# Patient Record
Sex: Male | Born: 1970 | Race: White | Hispanic: No | Marital: Married | Smoking: Never smoker
Health system: Southern US, Community
[De-identification: ages and names within clinical notes are randomized; demographics above are authoritative.]

## PROBLEM LIST (undated history)

## (undated) DIAGNOSIS — Z789 Other specified health status: Secondary | ICD-10-CM

## (undated) HISTORY — PX: OTHER SURGICAL HISTORY: SHX169

---

## 2012-07-02 ENCOUNTER — Encounter (HOSPITAL_COMMUNITY): Payer: Self-pay | Admitting: Pharmacy Technician

## 2012-07-02 NOTE — Patient Instructions (Addendum)
Your procedure is scheduled on: 07/07/2012  Report to [facility] at   6:15  AM.  Call this number if you have problems the morning of surgery: [phone]   Remember:   Do not drink or eat food:After Midnight.  :     Do not wear jewelry, make-up or nail polish.  Do not wear lotions, powders, or perfumes. You may wear deodorant.  Do not shave 48 hours prior to surgery. Men may shave face and neck.  Do not bring valuables to the hospital.  Contacts, dentures or bridgework may not be worn into surgery.  Leave suitcase in the car. After surgery it may be brought to your room.  For patients admitted to the hospital, checkout time is 11:00 AM the day of discharge.   Patients discharged the day of surgery will not be allowed to drive home.    Special Instructions: Shower using CHG 2 nights before surgery and the night before surgery.  If you shower the day of surgery use CHG.  Use special wash - you have one bottle of CHG for all showers.  You should use approximately 1/3 of the bottle for each shower.   Please read over the following fact sheets that you were given: Pain Booklet, MRSA Information, Surgical Site Infection Prevention and Care and Recovery After Surgery  Umbilical Herniorrhaphy Care After Refer to this sheet in the next few weeks. These instructions provide you with information on caring for yourself after your procedure. Your caregiver may also give you more specific instructions. Your treatment has been planned according to current medical practices, but problems sometimes occur. Call your caregiver if you have any problems or questions after your procedure. HOME CARE INSTRUCTIONS  If you are given antibiotic medicine, take it as directed. Finish it even if you start to feel better.  Only take over-the-counter or prescription medicines for pain, fever, or discomfort as directed by your caregiver. Do not take aspirin. It can cause bleeding.  Do not get your surgical cut  (incision) area wet unless your caregiver says it is okay.  Avoid lifting objects heavier than 10 lb (4.5 kg) for 8 weeks after surgery.  Avoid sexual activity for 5 weeks after surgery or as directed by your caregiver.  Do not drive while taking prescription pain medicine.  You may return to your other normal, daily activities after 3 days or as directed by your caregiver. SEEK MEDICAL CARE IF:  You notice blood or fluid leaking from the surgical site.  Your incision area becomes red or swollen.  Your pain at the surgical site becomes worse or is not relieved by medicine.  You have problems urinating.  You feel nauseous or vomit more than 2 days after surgery.  You notice the bulge in your abdomen returns after the procedure. SEEK IMMEDIATE MEDICAL CARE IF:  You have a fever.  You have nausea or vomiting that will not stop. MAKE SURE YOU:  Understand these instructions.  Will watch your condition.  Will get help right away if you are not doing well or get worse. Document Released: 10/09/2011 Document Reviewed: 07/11/2011 [facility] Patient Information 2013 [place], [place]. PATIENT INSTRUCTIONS POST-ANESTHESIA  IMMEDIATELY FOLLOWING SURGERY:  Do not drive or operate machinery for the first twenty four hours after surgery.  Do not make any important decisions for twenty four hours after surgery or while taking narcotic pain medications or sedatives.  If you develop intractable nausea and vomiting or a severe headache please notify your  doctor immediately.  FOLLOW-UP:  Please make an appointment with your surgeon as instructed. You do not need to follow up with anesthesia unless specifically instructed to do so.  WOUND CARE INSTRUCTIONS (if applicable):  Keep a dry clean dressing on the anesthesia/puncture wound site if there is drainage.  Once the wound has quit draining you may leave it open to air.  Generally you should leave the bandage intact for twenty four hours unless  there is drainage.  If the epidural site drains for more than 36-48 hours please call the anesthesia department.  QUESTIONS?:  Please feel free to call your physician or the hospital operator if you have any questions, and they will be happy to assist you.

## 2012-07-03 ENCOUNTER — Encounter (HOSPITAL_COMMUNITY)
Admission: RE | Admit: 2012-07-03 | Discharge: 2012-07-03 | Disposition: A | Discharge status: Home / Self Care | Payer: BC Managed Care – PPO | Source: Ambulatory Visit | Attending: General Surgery | Admitting: General Surgery

## 2012-07-03 ENCOUNTER — Encounter (HOSPITAL_COMMUNITY): Payer: Self-pay

## 2012-07-03 HISTORY — DX: Other specified health status: Z78.9

## 2012-07-03 LAB — BASIC METABOLIC PANEL
GFR calc Af Amer: >90 mL/min (ref >90)
GFR calc non Af Amer: >90 mL/min (ref >90)
Potassium: 4.5 mEq/L (ref 3.5–5.1)
Sodium: 140 mEq/L (ref 135–145)

## 2012-07-03 LAB — CBC WITH DIFFERENTIAL/PLATELET
Hemoglobin: 15.3 g/dL (ref 13.0–17.0)
Lymphs Abs: 2.1 10*3/uL (ref 0.7–4.0)
Monocytes Relative: 8 % (ref 3–12)
Neutro Abs: 2.9 10*3/uL (ref 1.7–7.7)
Neutrophils Relative %: 51 % (ref 43–77)
RBC: 4.98 MIL/uL (ref 4.22–5.81)
WBC: 5.6 10*3/uL (ref 4.0–10.5)

## 2012-07-03 LAB — SURGICAL PCR SCREEN
MRSA, PCR: NEGATIVE
Staphylococcus aureus: NEGATIVE

## 2012-07-07 ENCOUNTER — Encounter (HOSPITAL_COMMUNITY): Payer: Self-pay | Admitting: *Deleted

## 2012-07-07 ENCOUNTER — Encounter (HOSPITAL_COMMUNITY): Payer: Self-pay | Admitting: Anesthesiology

## 2012-07-07 ENCOUNTER — Ambulatory Visit (HOSPITAL_COMMUNITY): Payer: BC Managed Care – PPO | Admitting: Anesthesiology

## 2012-07-07 ENCOUNTER — Ambulatory Visit (HOSPITAL_COMMUNITY)
Admission: RE | Admit: 2012-07-07 | Discharge: 2012-07-07 | Disposition: A | Discharge status: Home / Self Care | Payer: BC Managed Care – PPO | Source: Ambulatory Visit | Attending: General Surgery | Admitting: General Surgery

## 2012-07-07 ENCOUNTER — Encounter (HOSPITAL_COMMUNITY)
Admission: RE | Disposition: A | Discharge status: Home / Self Care | Payer: Self-pay | Source: Ambulatory Visit | Attending: General Surgery

## 2012-07-07 DIAGNOSIS — K429 Umbilical hernia without obstruction or gangrene: Secondary | ICD-10-CM

## 2012-07-07 DIAGNOSIS — Z01812 Encounter for preprocedural laboratory examination: Secondary | ICD-10-CM | POA: Insufficient documentation

## 2012-07-07 HISTORY — PX: UMBILICAL HERNIA REPAIR: SHX196

## 2012-07-07 HISTORY — PX: INSERTION OF MESH: SHX5868

## 2012-07-07 SURGERY — REPAIR, HERNIA, UMBILICAL, ADULT
Anesthesia: General | Site: Abdomen | Wound class: Clean

## 2012-07-07 MED ORDER — PROPOFOL 10 MG/ML IV EMUL
INTRAVENOUS | Status: AC
  Filled 2012-07-07: qty 20

## 2012-07-07 MED ORDER — BUPIVACAINE HCL (PF) 0.5 % IJ SOLN
INTRAMUSCULAR | Status: DC | PRN
  Administered 2012-07-07: 10 mL

## 2012-07-07 MED ORDER — MIDAZOLAM HCL 2 MG/2ML IJ SOLN
1.0000 mg | INTRAMUSCULAR | Status: DC | PRN
  Administered 2012-07-07: 2 mg via INTRAVENOUS

## 2012-07-07 MED ORDER — CEFAZOLIN SODIUM-DEXTROSE 2-3 GM-% IV SOLR
2.0000 g | INTRAVENOUS | Status: AC
  Administered 2012-07-07: 2 g via INTRAVENOUS

## 2012-07-07 MED ORDER — LIDOCAINE HCL (PF) 1 % IJ SOLN
INTRAMUSCULAR | Status: AC
  Filled 2012-07-07: qty 5

## 2012-07-07 MED ORDER — SODIUM CHLORIDE 0.9 % IR SOLN
Status: DC | PRN
  Administered 2012-07-07: 1000 mL

## 2012-07-07 MED ORDER — LACTATED RINGERS IV SOLN
INTRAVENOUS | Status: DC
  Administered 2012-07-07: 1000 mL via INTRAVENOUS

## 2012-07-07 MED ORDER — BUPIVACAINE HCL (PF) 0.5 % IJ SOLN
INTRAMUSCULAR | Status: AC
  Filled 2012-07-07: qty 30

## 2012-07-07 MED ORDER — ONDANSETRON HCL 4 MG/2ML IJ SOLN
INTRAMUSCULAR | Status: AC
  Filled 2012-07-07: qty 2

## 2012-07-07 MED ORDER — ENOXAPARIN SODIUM 40 MG/0.4ML ~~LOC~~ SOLN
SUBCUTANEOUS | Status: AC
  Filled 2012-07-07: qty 0.4

## 2012-07-07 MED ORDER — CEFAZOLIN SODIUM-DEXTROSE 2-3 GM-% IV SOLR
INTRAVENOUS | Status: AC
  Filled 2012-07-07: qty 50

## 2012-07-07 MED ORDER — ENOXAPARIN SODIUM 40 MG/0.4ML ~~LOC~~ SOLN
40.0000 mg | Freq: Once | SUBCUTANEOUS | Status: AC
  Administered 2012-07-07: 40 mg via SUBCUTANEOUS

## 2012-07-07 MED ORDER — HYDROCODONE-ACETAMINOPHEN 5-325 MG PO TABS: 1.0000 | ORAL_TABLET | ORAL | Status: DC | PRN

## 2012-07-07 MED ORDER — ONDANSETRON HCL 4 MG/2ML IJ SOLN
4.0000 mg | Freq: Once | INTRAMUSCULAR | Status: AC
  Administered 2012-07-07: 4 mg via INTRAVENOUS

## 2012-07-07 MED ORDER — LIDOCAINE HCL 1 % IJ SOLN
INTRAMUSCULAR | Status: DC | PRN
  Administered 2012-07-07: 30 mg via INTRADERMAL

## 2012-07-07 MED ORDER — PROPOFOL 10 MG/ML IV EMUL
INTRAVENOUS | Status: DC | PRN
  Administered 2012-07-07: 180 mg via INTRAVENOUS

## 2012-07-07 MED ORDER — MIDAZOLAM HCL 2 MG/2ML IJ SOLN
INTRAMUSCULAR | Status: AC
  Filled 2012-07-07: qty 2

## 2012-07-07 MED ORDER — CELECOXIB 100 MG PO CAPS
ORAL_CAPSULE | ORAL | Status: AC
  Filled 2012-07-07: qty 4

## 2012-07-07 MED ORDER — CHLORHEXIDINE GLUCONATE 4 % EX LIQD: 1.0000 "application " | Freq: Once | CUTANEOUS | Status: DC

## 2012-07-07 MED ORDER — FENTANYL CITRATE 0.05 MG/ML IJ SOLN: 25.0000 ug | INTRAMUSCULAR | Status: DC | PRN

## 2012-07-07 MED ORDER — LACTATED RINGERS IV SOLN
INTRAVENOUS | Status: DC | PRN
  Administered 2012-07-07: 07:00:00 via INTRAVENOUS

## 2012-07-07 MED ORDER — ROCURONIUM BROMIDE 50 MG/5ML IV SOLN
INTRAVENOUS | Status: AC
  Filled 2012-07-07: qty 1

## 2012-07-07 MED ORDER — SURGILUBE EX GEL
CUTANEOUS | Status: AC
  Filled 2012-07-07: qty 3

## 2012-07-07 MED ORDER — ONDANSETRON HCL 4 MG/2ML IJ SOLN: 4.0000 mg | Freq: Once | INTRAMUSCULAR | Status: DC | PRN

## 2012-07-07 MED ORDER — CELECOXIB 100 MG PO CAPS
400.0000 mg | ORAL_CAPSULE | Freq: Every day | ORAL | Status: AC
  Administered 2012-07-07: 400 mg via ORAL

## 2012-07-07 MED ORDER — FENTANYL CITRATE 0.05 MG/ML IJ SOLN
INTRAMUSCULAR | Status: DC | PRN
  Administered 2012-07-07 (×2): 50 ug via INTRAVENOUS
  Administered 2012-07-07: 100 ug via INTRAVENOUS
  Administered 2012-07-07: 50 ug via INTRAVENOUS

## 2012-07-07 MED ORDER — FENTANYL CITRATE 0.05 MG/ML IJ SOLN
INTRAMUSCULAR | Status: AC
  Filled 2012-07-07: qty 5

## 2012-07-07 SURGICAL SUPPLY — 36 items
BAG HAMPER (MISCELLANEOUS) ×2 IMPLANT
BENZOIN TINCTURE PRP APPL 2/3 (GAUZE/BANDAGES/DRESSINGS) ×2 IMPLANT
CLOTH BEACON ORANGE TIMEOUT ST (SAFETY) ×2 IMPLANT
COVER LIGHT HANDLE STERIS (MISCELLANEOUS) ×4 IMPLANT
DECANTER SPIKE VIAL GLASS SM (MISCELLANEOUS) ×2 IMPLANT
DURAPREP 26ML APPLICATOR (WOUND CARE) ×2 IMPLANT
ELECT REM PT RETURN 9FT ADLT (ELECTROSURGICAL) ×2
ELECTRODE REM PT RTRN 9FT ADLT (ELECTROSURGICAL) ×1 IMPLANT
GLOVE BIOGEL PI IND STRL 6.5 (GLOVE) ×1 IMPLANT
GLOVE BIOGEL PI IND STRL 7.0 (GLOVE) ×2 IMPLANT
GLOVE BIOGEL PI IND STRL 7.5 (GLOVE) ×1 IMPLANT
GLOVE BIOGEL PI INDICATOR 6.5 (GLOVE) ×1
GLOVE BIOGEL PI INDICATOR 7.0 (GLOVE) ×2
GLOVE BIOGEL PI INDICATOR 7.5 (GLOVE) ×1
GLOVE ECLIPSE 6.5 STRL STRAW (GLOVE) ×2 IMPLANT
GLOVE ECLIPSE 7.0 STRL STRAW (GLOVE) ×4 IMPLANT
GOWN STRL REIN XL XLG (GOWN DISPOSABLE) ×6 IMPLANT
INST SET MINOR GENERAL (KITS) ×2 IMPLANT
KIT ROOM TURNOVER APOR (KITS) ×2 IMPLANT
MANIFOLD NEPTUNE II (INSTRUMENTS) ×2 IMPLANT
NEEDLE HYPO 25X1 1.5 SAFETY (NEEDLE) ×2 IMPLANT
NS IRRIG 1000ML POUR BTL (IV SOLUTION) ×2 IMPLANT
PACK MINOR (CUSTOM PROCEDURE TRAY) ×2 IMPLANT
PAD ARMBOARD 7.5X6 YLW CONV (MISCELLANEOUS) ×2 IMPLANT
PATCH VENTRAL SMALL 4.3 (Mesh Specialty) ×2 IMPLANT
SET BASIN LINEN APH (SET/KITS/TRAYS/PACK) ×2 IMPLANT
STRIP CLOSURE SKIN 1/2X4 (GAUZE/BANDAGES/DRESSINGS) ×2 IMPLANT
SUT ETHIBOND CT1 BRD 2-0 30IN (SUTURE) IMPLANT
SUT ETHIBOND NAB MO 7 #0 18IN (SUTURE) ×2 IMPLANT
SUT MNCRL AB 4-0 PS2 18 (SUTURE) ×2 IMPLANT
SUT VIC AB 2-0 CT2 27 (SUTURE) IMPLANT
SUT VIC AB 3-0 SH 27 (SUTURE) ×1
SUT VIC AB 3-0 SH 27X BRD (SUTURE) ×1 IMPLANT
SUT VICRYL AB 3 0 TIES (SUTURE) IMPLANT
SYR BULB IRRIGATION 50ML (SYRINGE) ×2 IMPLANT
SYR CONTROL 10ML LL (SYRINGE) ×2 IMPLANT

## 2012-07-07 NOTE — Preoperative (Signed)
Beta Blockers   Reason not to administer Beta Blockers:Not Applicable 

## 2012-07-07 NOTE — Transfer of Care (Signed)
Immediate Anesthesia Transfer of Care Note  Patient: Jesse Wilkins  Procedure(s) Performed: Procedure(s): HERNIA REPAIR UMBILICAL ADULT (N/A) INSERTION OF MESH (N/A)  Patient Location: PACU  Anesthesia Type:General  Level of Consciousness: awake, alert  and oriented  Airway & Oxygen Therapy: Patient Spontanous Breathing and Patient connected to nasal cannula oxygen  Post-op Assessment: Report given to PACU RN and Post -op Vital signs reviewed and stable  Post vital signs: Reviewed and stable  Complications: No apparent anesthesia complications

## 2012-07-07 NOTE — Anesthesia Preprocedure Evaluation (Signed)
Anesthesia Evaluation  Patient identified by MRN, date of birth, ID band Patient awake    Reviewed: Allergy & Precautions, H&P , NPO status , Patient's Chart, lab work & pertinent test results  History of Anesthesia Complications Negative for: history of anesthetic complications  Airway Mallampati: II  TM Distance: >3 FB     Dental  (+) Teeth Intact   Pulmonary neg pulmonary ROS,  breath sounds clear to auscultation        Cardiovascular negative cardio ROS  Rhythm:Regular Rate:Normal     Neuro/Psych    GI/Hepatic negative GI ROS,   Endo/Other    Renal/GU      Musculoskeletal   Abdominal   Peds  Hematology   Anesthesia Other Findings   Reproductive/Obstetrics                             Anesthesia Physical Anesthesia Plan  ASA: I  Anesthesia Plan: General   Post-op Pain Management:    Induction: Intravenous  Airway Management Planned: LMA  Additional Equipment:   Intra-op Plan:   Post-operative Plan: Extubation in OR  Informed Consent: I have reviewed the patients History and Physical, chart, labs and discussed the procedure including the risks, benefits and alternatives for the proposed anesthesia with the patient or authorized representative who has indicated his/her understanding and acceptance.     Plan Discussed with:   Anesthesia Plan Comments:         Anesthesia Quick Evaluation  

## 2012-07-07 NOTE — Op Note (Signed)
Patient:  Jesse Wilkins  DOB:  [birth year]  MRN:  [id number]   Preop Diagnosis:  Umbilical hernia  Postop Diagnosis:  The same  Procedure:  Umbilical hernia repair with ventral patch  Surgeon:  Dr. Tilford Pillar  Anes:  General via laryngeal mask airway  Indications:  Patient is a 42 year old male who presented to my office with a history of a umbilical bulge. Workup and evaluation was consistent for an umbilical hernia. Risks benefits alternatives an umbilical hernia repair were discussed at length patient including but not limited to risk of bleeding, infection, infection and mesh requiring removal and subsequent repair, recurrence, intraoperative cardiac and pulmonary events. Patient's questions and concerns are addressed the patient as consented for the planned procedure.  Procedure note:  Patient was taken to the operating room was placed in a supine position the or to able which time the general anesthesia was administered. With the patient was asleep, a laryngeal mask airway was placed by the nurse anesthetist. At this point his abdomen was prepped with DuraPrep solution and draped in standard fashion. A skin incision is marked after time out was performed. The incision was created with a 15 blade scalpel with a semilunar incision infra-umbilically. Additional dissection down to subcuticular tissues carried out using electrocautery. The fascia is identified. The hernia defect is identified. The hernia sac and umbilical stalk are bluntly dissected around using a hemostat. The hernia sac is dissected carefully off of the umbilical stalk. The hernia sac is circumferentially freed from the fascia and reduced back into the abdominal cavity. At this point inspection of the umbilical fascial defect demonstrated a sizable opening. I opted to ring a ventral patch to the field and inserted this into the defect. This was secured to the fascia with 0 Ethibond suture. The fascia was reapproximated over  the patch using interrupted 0 Ethibond suture. As quite pleased with the appearance of the repair. The wound was irrigated. Local anesthetic was instilled. A 3-0 Vicryl suture was utilized to pexed the umbilical stalk to the fascia. The deep subcuticular tissue was reapproximated using a 3-0 Vicryl in simple or fashion. The skin edges reapproximated using a 4-0 Monocryl in a running subcuticular suture. Skin was washed dried moist dry towel. Benzoin is applied around the incision. Half-inch are suture placed. The drapes removed. The patient left come out of general anesthetic. He is transferred to the PACU in stable condition. At the conclusion of procedure all instrument, sponge, needle counts are correct.  Patient tolerated procedure extremely well.  Complications:  None apparent  EBL:  Minimal  Specimen:  None

## 2012-07-07 NOTE — Anesthesia Postprocedure Evaluation (Signed)
  Anesthesia Post-op Note  Patient: Jesse Wilkins  Procedure(s) Performed: Procedure(s): HERNIA REPAIR UMBILICAL ADULT (N/A) INSERTION OF MESH (N/A)  Patient Location: PACU  Anesthesia Type:General  Level of Consciousness: awake, alert , oriented and patient cooperative  Airway and Oxygen Therapy: Patient Spontanous Breathing  Post-op Pain: mild  Post-op Assessment: Post-op Vital signs reviewed, Patient's Cardiovascular Status Stable, Respiratory Function Stable, Patent Airway, No signs of Nausea or vomiting, Adequate PO intake, Pain level controlled, No headache, No backache, No residual numbness and No residual motor weakness  Post-op Vital Signs: Reviewed and stable  Complications: No apparent anesthesia complications

## 2012-07-07 NOTE — Interval H&P Note (Signed)
History and Physical Interval Note:  07/07/2012 7:52 AM  Jesse Wilkins  has presented today for surgery, with the diagnosis of umbilical hernia  The various methods of treatment have been discussed with the patient and family. After consideration of risks, benefits and other options for treatment, the patient has consented to  Procedure(s): HERNIA REPAIR UMBILICAL ADULT (N/A) as a surgical intervention .  The patient's history has been reviewed, patient examined, no change in status, stable for surgery.  I have reviewed the patient's chart and labs.  Questions were answered to the patient's satisfaction.     [name]

## 2012-07-07 NOTE — H&P (Signed)
  NTS SOAP Note  Vital Signs:  Vitals as of: 07/01/2012: Systolic 129: Diastolic 83: Heart Rate 65: Temp 98.60F: Height 54ft 11in: Weight 209Lbs 0 Ounces: Pain Level 2: BMI 29  BMI : 29.15 kg/m2  Subjective: This 42 Years 39 Months old Male presents for of umbilical hernia.  Noted over the last couples years "innie became outie"  Some discomfort/pain with straining.  Had to lift daughter several times and noted increased abdominal pain around that area.  No change with BM but occassional nausea.  No fever or chills.  No episodes where he was unable to reduce. Occassional lightheadedness with pain.   Review of Symptoms:  Constitutional:unremarkable   Head:unremarkable    Eyes:unremarkable   Nose/Mouth/Throat:unremarkable Cardiovascular:  unremarkable   Respiratory:unremarkable       as per HPI Genitourinary:unremarkable     Musculoskeletal:unremarkable   Skin:unremarkable Breast:unremarkable   Hematolgic/Lymphatic:unremarkable     Allergic/Immunologic:unremarkable     Past Medical History:  Obtained     Past Medical History  Surgical History: none Medical Problems: none Psychiatric History: none Allergies: NKDA Medications: none   Social History:Obtained  Social History  Preferred Language: English Race:  White Ethnicity: Not Hispanic / Latino Age: 42 Years 4 Months Marital Status:  M Alcohol: none Recreational drug(s): none   Smoking Status: Never smoker reviewed on 07/01/2012 Functional Status reviewed on mm/dd/yyyy ------------------------------------------------ Bathing: Normal Cooking: Normal Dressing: Normal Driving: Normal Eating: Normal Managing Meds: Normal Oral Care: Normal Shopping: Normal Toileting: Normal Transferring: Normal Walking: Normal Cognitive Status reviewed on mm/dd/yyyy ------------------------------------------------ Attention: Normal Decision Making: Normal Language: Normal Memory:  Normal Motor: Normal Perception: Normal Problem Solving: Normal Visual and Spatial: Normal   Family History:Obtained     Family History  Is there a family history WG:[id number]    Objective Information: General:  Well appearing, well nourished in no distress. Skin:     no rash or prominent lesions Head:Atraumatic; no masses; no abnormalities Eyes:  conjunctiva clear, EOM intact, PERRL Mouth:  Mucous membranes moist, no mucosal lesions. Neck:  Supple without lymphadenopathy.  Heart:  RRR, no murmur Lungs:    CTA bilaterally, no wheezes, rhonchi, rales.  Breathing unlabored. Abdomen:Soft, NT/ND, no HSM, no masses.  + reducible umbilical hernia. Extremities:  No deformities, clubbing, cyanosis, or edema.   Assessment:    Plan: Umbilical hernia.  Options discussed with the patient.  Will schedule  at his convenience.    Patient Education:Alternative treatments to surgery were discussed with patient (and family).  Risks and benefits  of procedure were fully explained to the patient (and family) who gave informed consent. Patient/family questions were addressed.  Follow-up:Pending Surgery

## 2012-07-09 ENCOUNTER — Encounter (HOSPITAL_COMMUNITY): Payer: Self-pay | Admitting: General Surgery

## 2012-08-25 ENCOUNTER — Emergency Department (HOSPITAL_COMMUNITY)
Admission: EM | Admit: 2012-08-25 | Discharge: 2012-08-25 | Disposition: A | Discharge status: Home / Self Care | Payer: BC Managed Care – PPO | Attending: Emergency Medicine | Admitting: Emergency Medicine

## 2012-08-25 ENCOUNTER — Emergency Department (HOSPITAL_COMMUNITY): Payer: BC Managed Care – PPO

## 2012-08-25 ENCOUNTER — Encounter (HOSPITAL_COMMUNITY): Payer: Self-pay

## 2012-08-25 DIAGNOSIS — R6883 Chills (without fever): Secondary | ICD-10-CM | POA: Insufficient documentation

## 2012-08-25 DIAGNOSIS — R109 Unspecified abdominal pain: Secondary | ICD-10-CM | POA: Insufficient documentation

## 2012-08-25 DIAGNOSIS — Y849 Medical procedure, unspecified as the cause of abnormal reaction of the patient, or of later complication, without mention of misadventure at the time of the procedure: Secondary | ICD-10-CM | POA: Insufficient documentation

## 2012-08-25 DIAGNOSIS — IMO0001 Reserved for inherently not codable concepts without codable children: Secondary | ICD-10-CM

## 2012-08-25 DIAGNOSIS — T8140XA Infection following a procedure, unspecified, initial encounter: Secondary | ICD-10-CM | POA: Insufficient documentation

## 2012-08-25 DIAGNOSIS — L089 Local infection of the skin and subcutaneous tissue, unspecified: Secondary | ICD-10-CM | POA: Insufficient documentation

## 2012-08-25 LAB — BASIC METABOLIC PANEL
BUN: 12 mg/dL (ref 6–23)
CO2: 29 mEq/L (ref 19–32)
Calcium: 9 mg/dL (ref 8.4–10.5)
Chloride: 101 mEq/L (ref 96–112)
Creatinine, Ser: 0.99 mg/dL (ref 0.50–1.35)
GFR calc Af Amer: >90 mL/min (ref >90)
GFR calc non Af Amer: >90 mL/min (ref >90)
Glucose, Bld: 93 mg/dL (ref 70–99)
Potassium: 4.1 mEq/L (ref 3.5–5.1)
Sodium: 137 mEq/L (ref 135–145)

## 2012-08-25 LAB — CBC WITH DIFFERENTIAL/PLATELET
Basophils Absolute: 0 10*3/uL (ref 0.0–0.1)
Basophils Relative: 0 % (ref 0–1)
Eosinophils Absolute: 0.1 10*3/uL (ref 0.0–0.7)
Eosinophils Relative: 3 % (ref 0–5)
HCT: 40 % (ref 39.0–52.0)
Hemoglobin: 14.1 g/dL (ref 13.0–17.0)
MCH: 31 pg (ref 26.0–34.0)
MCHC: 35.3 g/dL (ref 30.0–36.0)
MCV: 87.9 fL (ref 78.0–100.0)
Monocytes Absolute: 0.3 10*3/uL (ref 0.1–1.0)
Monocytes Relative: 6 % (ref 3–12)
RDW: 12.1 % (ref 11.5–15.5)

## 2012-08-25 MED ORDER — SULFAMETHOXAZOLE-TRIMETHOPRIM 800-160 MG PO TABS: 1.0000 | ORAL_TABLET | Freq: Two times a day (BID) | ORAL | Status: AC

## 2012-08-25 MED ORDER — IOHEXOL 300 MG/ML  SOLN
100.0000 mL | Freq: Once | INTRAMUSCULAR | Status: AC | PRN
  Administered 2012-08-25: 100 mL via INTRAVENOUS

## 2012-08-25 NOTE — ED Notes (Signed)
Pt states he had a hernia repair in March by Dr. Elisabeth Pigeon he was having some drainage and was evaluated by Dr. Caesar Bookman this past Thursday and was given Keflex. States the surgical site is red, hard and on Saturday morning there was a lot of bleeding from the site.

## 2012-08-25 NOTE — ED Provider Notes (Signed)
History    This chart was scribed for Dione Booze, MD by Leone Payor, ED Scribe. This patient was seen in room [facility] and the patient's care was started 11:50 AM.   CSN: [id number]  Arrival date & time 08/25/12  1032   First MD Initiated Contact with Patient 08/25/12 1143      Chief Complaint  Patient presents with  . Wound Infection     The history is provided by the patient. No language interpreter was used.    Jesse Wilkins is a 42 y.o. male who presents to the Emergency Department complaining of a wound infection. Pt states he had an umbilical hernia repair in March 2014 by Dr. Caesar Bookman. States he was having some drainage and was evaluated by Dr. Caesar Bookman this past Thrusday (08/21/12) and was given Keflex. He states the surgical site is red, hard, and 1 day ago there was bleeding from the site. He rates the pain as 3/10 when there is no pressure applied. He had some chills earlier this week. He denies any fever, diaphoresis.    Past Medical History  Diagnosis Date  . Medical history non-contributory     Past Surgical History  Procedure Laterality Date  . Adnoid    . Umbilical hernia repair N/A 07/07/2012    Procedure: HERNIA REPAIR UMBILICAL ADULT;  Surgeon: Fabio Bering, MD;  Location: [facility];  Service: General;  Laterality: N/A;  . Insertion of mesh N/A 07/07/2012    Procedure: INSERTION OF MESH;  Surgeon: Fabio Bering, MD;  Location: [facility];  Service: General;  Laterality: N/A;    No family history on file.  History  Substance Use Topics  . Smoking status: Never Smoker   . Smokeless tobacco: Not on file  . Alcohol Use: No     Comment: occasionaly      Review of Systems  Constitutional: Positive for chills. Negative for fever and diaphoresis.  All other systems reviewed and are negative.    Allergies  Review of patient's allergies indicates no known allergies.  Home Medications   Current Outpatient Rx  Name  Route  Sig  Dispense  Refill  .  cephALEXin (KEFLEX) 500 MG capsule   Oral   Take 500 mg by mouth 4 (four) times daily. Starting 08/21/2012 x 5 days.         Marland Kitchen ibuprofen (ADVIL,MOTRIN) 200 MG tablet   Oral   Take 600 mg by mouth every 6 (six) hours as needed for pain.           BP 140/90  Pulse 72  Temp(Src) 98.1 F (36.7 C) (Oral)  Resp 18  Ht 5\' 11"  (1.803 m)  Wt 205 lb (92.987 kg)  BMI 28.6 kg/m2  SpO2 100%  Physical Exam  Nursing note and vitals reviewed. Constitutional: He is oriented to person, place, and time. He appears well-developed and well-nourished. No distress.  HENT:  Head: Normocephalic and atraumatic.  Eyes: EOM are normal.  Neck: Neck supple. No tracheal deviation present.  Cardiovascular: Normal rate, regular rhythm and normal heart sounds.   Pulmonary/Chest: Effort normal. No respiratory distress.  Abdominal:  Surgical scar inferior to umbilicus that appears to be healing well without apparent infection. Area of erythema, induration in the umbilicus with slight sanguinous drainage. Periumbilical area is moderately indurated and tender.  Musculoskeletal: Normal range of motion.  Neurological: He is alert and oriented to person, place, and time.  Skin: Skin is warm and dry.  Psychiatric: He has  a normal mood and affect. His behavior is normal.    ED Course  Procedures (including critical care time)  DIAGNOSTIC STUDIES: Oxygen Saturation is 100% on room air, normal by my interpretation.    COORDINATION OF CARE: 11:50 AM Discussed treatment plan with pt at bedside and pt agreed to plan.   Results for orders placed during the hospital encounter of 08/25/12  CBC WITH DIFFERENTIAL      Result Value Range   WBC 5.2  4.0 - 10.5 K/uL   RBC 4.55  4.22 - 5.81 MIL/uL   Hemoglobin 14.1  13.0 - 17.0 g/dL   HCT 11.9  14.7 - 82.9 %   MCV 87.9  78.0 - 100.0 fL   MCH 31.0  26.0 - 34.0 pg   MCHC 35.3  30.0 - 36.0 g/dL   RDW 56.2  13.0 - 86.5 %   Platelets 214  150 - 400 K/uL   Neutrophils  Relative 51  43 - 77 %   Neutro Abs 2.7  1.7 - 7.7 K/uL   Lymphocytes Relative 41  12 - 46 %   Lymphs Abs 2.1  0.7 - 4.0 K/uL   Monocytes Relative 6  3 - 12 %   Monocytes Absolute 0.3  0.1 - 1.0 K/uL   Eosinophils Relative 3  0 - 5 %   Eosinophils Absolute 0.1  0.0 - 0.7 K/uL   Basophils Relative 0  0 - 1 %   Basophils Absolute 0.0  0.0 - 0.1 K/uL  BASIC METABOLIC PANEL      Result Value Range   Sodium 137  135 - 145 mEq/L   Potassium 4.1  3.5 - 5.1 mEq/L   Chloride 101  96 - 112 mEq/L   CO2 29  19 - 32 mEq/L   Glucose, Bld 93  70 - 99 mg/dL   BUN 12  6 - 23 mg/dL   Creatinine, Ser 7.84  0.50 - 1.35 mg/dL   Calcium 9.0  8.4 - 69.6 mg/dL   GFR calc non Af Amer >90  >90 mL/min   GFR calc Af Amer >90  >90 mL/min   Ct Abdomen Pelvis W Contrast  08/25/2012  *RADIOLOGY REPORT*  Clinical Data: Umbilical hernia repair  CT ABDOMEN AND PELVIS WITH CONTRAST  Technique:  Multidetector CT imaging of the abdomen and pelvis was performed following the standard protocol during bolus administration of intravenous contrast.  Contrast: OMNIPAQUE IOHEXOL 300 MG/ML  SOLN  Comparison: None.  Findings: Postoperative changes from umbilical hernia repair are present. There is no evidence of recurrent hernia.  At the hernia site, within the anterior intraperitoneal space, just deep to the hernia site, there is a small irregular round hyperdense structure with a small amount of fluid centrally.  The fluid measures 1.5 x 0.6 x 0.9 cm and contains an air bubble.  This may epresent a small amount of fluid within a hernia plug or patch for an inflammatory process.  There is also a 8.8 x 1.8 x 0.9 cm fluid collection within the subcutaneous fat at the hernia repair site containing a few gas bubbles.  This does extend to within 1 cm of the scan. Again, an inflammatory process or abscess cannot be excluded.  Diffuse hepatic steatosis.  The gallbladder is folded upon itself.  Spleen, pancreas, adrenal glands, kidneys are  within normal limits.  Bladder, prostate, and seminal vesicles are within normal limits.  No free fluid within the pelvis.  No evidence of abnormal  retroperitoneal adenopathy.  Small mesenteric nodes are noted.  No evidence of bowel obstruction.  No vertebral compression deformity.  Lumbosacral spine is transitional.  IMPRESSION: Status post umbilical hernia repair without evidence of recurrent hernia.  There are two very small fluid collections deep and superficial to the abdominal wall hernia repair site.  An inflammatory process is not excluded.   Original Report Authenticated By: Jolaine Click, M.D.       1. Wound, surgical, infected, initial encounter       MDM  Draining sites in the umbilicus without clear evidence of infection. This may be just a draining seroma but there could be some low-grade infection which is being controlled by cephalexin. However, he has not gotten completely better on cephalexin. Case discussed with Dr. Lovell Sheehan who is on call for Dr. Leticia Penna and decision was made to switch him to trimethoprim-sulfamethoxazole. He is to followup with Dr. Leticia Penna but is to return to the ED if symptoms are worsening or he starts developing a fever. He states that he has adequate analgesics at home. Old records are reviewed and his umbilical hernia surgery apparently was a complicated at that time.   I personally performed the services described in this documentation, which was scribed in my presence. The recorded information has been reviewed and is accurate.         Dione Booze, MD 08/25/12 [phone]

## 2012-08-25 NOTE — ED Notes (Signed)
Pt states that he started having pain around his hernia repair incision on Thursday, 08/21/12, and noticed that the area was red, swollen and hard. Pt reports waking up "in a pool of blood" on Saturday morning. Pt then came in today because he has been having serosanguinous drainage from his umbilicus, and there is a flap of skin coming from inside his umbilicus. The nurse notes serosanguinous drainage from the umbilicus and hardened areas above and below the umbilicus. Pt reports having a mesh put in place during his hernia repair surgery.

## 2012-08-28 LAB — WOUND CULTURE

## 2012-08-28 NOTE — ED Notes (Signed)
Post ED Visit - Positive Culture Follow-up  Culture report reviewed by antimicrobial stewardship pharmacist: []  Jesse Wilkins, Pharm.D., BCPS [x]  Celedonio Miyamoto, [address].D., BCPS []  Georgina Pillion, Pharm.D., BCPS []  [place], [address].D., BCPS, AAHIVP []  Estella Husk, Pharm.D., BCPS, AAHIV  Positive wound culture Treated with Sulfa-Trim- organism sensitive to the same and no further patient follow-up is required at this time.  Jesse Wilkins 08/28/2012, 6:38 PM

## 2012-08-29 ENCOUNTER — Telehealth (HOSPITAL_COMMUNITY): Payer: Self-pay | Admitting: Emergency Medicine

## 2012-08-29 NOTE — ED Notes (Signed)
Post ED Visit - Positive Culture Follow-up  Culture report reviewed by antimicrobial stewardship pharmacist: []  Wes Dulaney, Pharm.D., BCPS [x]  Celedonio Miyamoto, Pharm.D., BCPS []  Georgina Pillion, Pharm.D., BCPS []  [place], [address].D., BCPS, AAHIVP []  Estella Husk, Pharm.D., BCPS, AAHIV  Positive urine culture Treated with keflex and bactrim, organism sensitive to the same and no further patient follow-up is required at this time.  Kylie A Holland 08/29/2012, 2:23 PM

## 2013-03-27 NOTE — H&P (Signed)
  NTS SOAP Note  Vital Signs:  Vitals as of: 03/27/2013: Systolic 135: Diastolic 86: Heart Rate 67: Temp 98.84F: Height 46ft 11in: Weight 212Lbs 0 Ounces: BMI 29.57  BMI : 29.57 kg/m2  Subjective: This 42 Years old Male presents for a lump that has develped above umbilicus.  Started to have pain and swelling abofe umbilicus this past weekend.  Took percocet and laid down.  Lump went away.  Made worse with straining.  s/p umbilical herniorrhaphy with mesh 5/14.   Review of Symptoms:  Constitutional:unremarkable   Head:unremarkable    Eyes:unremarkable   Nose/Mouth/Throat:unremarkable Cardiovascular:  unremarkable   Respiratory:unremarkable   Gastrointestin    abdominal pain Genitourinary:unremarkable     Musculoskeletal:unremarkable   Skin:unremarkable Hematolgic/Lymphatic:unremarkable     Allergic/Immunologic:unremarkable     Past Medical History:    Reviewed   Past Medical History  Surgical History: umbilical herniorrhaphy with mesh 5/14 Medical Problems: none Psychiatric History: none Allergies: NKDA Medications: none   Social History:Reviewed  Social History  Preferred Language: English Race:  White Ethnicity: Not Hispanic / Latino Age: 41 Years 4 Months Marital Status:  M Alcohol: none Recreational drug(s): none   Smoking Status: Never smoker reviewed on 02/19/2013 Functional Status reviewed on mm/dd/yyyy ------------------------------------------------ Bathing: Normal Cooking: Normal Dressing: Normal Driving: Normal Eating: Normal Managing Meds: Normal Oral Care: Normal Shopping: Normal Toileting: Normal Transferring: Normal Walking: Normal Cognitive Status reviewed on mm/dd/yyyy ------------------------------------------------ Attention: Normal Decision Making: Normal Language: Normal Memory: Normal Motor: Normal Perception: Normal Problem Solving: Normal Visual and Spatial: Normal   Family  History:  Reviewed   Family Health History  Is there a family history VH:[id number]    Objective Information: General:  Well appearing, well nourished in no distress. Heart:  RRR, no murmur Lungs:    CTA bilaterally, no wheezes, rhonchi, rales.  Breathing unlabored. Abdomen:Soft, NT/ND, no HSM, no masses.  Reducible hernia just superior to umbilicus.  No hernia at umbilicus.  Assessment:Incisional hernia just superior to previous repair.  ?new hernia as previous repair seems intact.  Diagnosis &amp; Procedure Smart Code   Plan:  Discussed surgical repair with patient.  I told him he has a hernia just above previous repair.  All questions were answered.  Will call to schedule incisional herniorrhaphy with mesh in 12/22.  Instructed on how to reduce hernia prior to surgery.  Percocet for pain.  Patient Education:Alternative treatments to surgery were discussed with patient (and family).  Risks and benefits  of procedure including bleeding, infection, and recurrence of the hernia were fully explained to the patient (and family) who gave informed consent. Patient/family questions were addressed.  Follow-up:Pending Surgery

## 2013-04-06 ENCOUNTER — Encounter (HOSPITAL_COMMUNITY): Payer: Self-pay | Admitting: Pharmacy Technician

## 2013-04-06 NOTE — Patient Instructions (Signed)
Jesse Wilkins  04/06/2013   Your procedure is scheduled on:  04/13/2013  Report to [facility] at  615  AM.  Call this number if you have problems the morning of surgery: [phone]   Remember:   Do not eat food or drink liquids after midnight.   Take these medicines the morning of surgery with A SIP OF WATER: none   Do not wear jewelry, make-up or nail polish.  Do not wear lotions, powders, or perfumes.   Do not shave 48 hours prior to surgery. Men may shave face and neck.  Do not bring valuables to the hospital.  [facility] is not responsible for any belongings or valuables.               Contacts, dentures or bridgework may not be worn into surgery.  Leave suitcase in the car. After surgery it may be brought to your room.  For patients admitted to the hospital, discharge time is determined by your  treatment team.               Patients discharged the day of surgery will not be allowed to drive home.  Name and phone number of your driver: family  Special Instructions: Shower using CHG 2 nights before surgery and the night before surgery.  If you shower the day of surgery use CHG.  Use special wash - you have one bottle of CHG for all showers.  You should use approximately 1/3 of the bottle for each shower.   Please read over the following fact sheets that you were given: Pain Booklet, Coughing and Deep Breathing, Surgical Site Infection Prevention, Anesthesia Post-op Instructions and Care and Recovery After Surgery Hernia A hernia occurs when an internal organ pushes out through a weak spot in the abdominal wall. Hernias most commonly occur in the groin and around the navel. Hernias often can be pushed back into place (reduced). Most hernias tend to get worse over time. Some abdominal hernias can get stuck in the opening (irreducible or incarcerated hernia) and cannot be reduced. An irreducible abdominal hernia which is tightly squeezed into the opening is at risk for  impaired blood supply (strangulated hernia). A strangulated hernia is a medical emergency. Because of the risk for an irreducible or strangulated hernia, surgery may be recommended to repair a hernia. CAUSES   Heavy lifting.  Prolonged coughing.  Straining to have a bowel movement.  A cut (incision) made during an abdominal surgery. HOME CARE INSTRUCTIONS   Bed rest is not required. You may continue your normal activities.  Avoid lifting more than 10 pounds (4.5 kg) or straining.  Cough gently. If you are a smoker it is best to stop. Even the best hernia repair can break down with the continual strain of coughing. Even if you do not have your hernia repaired, a cough will continue to aggravate the problem.  Do not wear anything tight over your hernia. Do not try to keep it in with an outside bandage or truss. These can damage abdominal contents if they are trapped within the hernia sac.  Eat a normal diet.  Avoid constipation. Straining over long periods of time will increase hernia size and encourage breakdown of repairs. If you cannot do this with diet alone, stool softeners may be used. SEEK IMMEDIATE MEDICAL CARE IF:   You have a fever.  You develop increasing abdominal pain.  You feel nauseous or vomit.  Your hernia is stuck  outside the abdomen, looks discolored, feels hard, or is tender.  You have any changes in your bowel habits or in the hernia that are unusual for you.  You have increased pain or swelling around the hernia.  You cannot push the hernia back in place by applying gentle pressure while lying down. MAKE SURE YOU:   Understand these instructions.  Will watch your condition.  Will get help right away if you are not doing well or get worse. Document Released: 04/09/2005 Document Revised: 07/02/2011 Document Reviewed: 11/27/2007 [facility] Patient Information 2014 [place]. PATIENT INSTRUCTIONS POST-ANESTHESIA  IMMEDIATELY FOLLOWING SURGERY:  Do  not drive or operate machinery for the first twenty four hours after surgery.  Do not make any important decisions for twenty four hours after surgery or while taking narcotic pain medications or sedatives.  If you develop intractable nausea and vomiting or a severe headache please notify your doctor immediately.  FOLLOW-UP:  Please make an appointment with your surgeon as instructed. You do not need to follow up with anesthesia unless specifically instructed to do so.  WOUND CARE INSTRUCTIONS (if applicable):  Keep a dry clean dressing on the anesthesia/puncture wound site if there is drainage.  Once the wound has quit draining you may leave it open to air.  Generally you should leave the bandage intact for twenty four hours unless there is drainage.  If the epidural site drains for more than 36-48 hours please call the anesthesia department.  QUESTIONS?:  Please feel free to call your physician or the hospital operator if you have any questions, and they will be happy to assist you.

## 2013-04-07 ENCOUNTER — Encounter (HOSPITAL_COMMUNITY)
Admission: RE | Admit: 2013-04-07 | Discharge: 2013-04-07 | Disposition: A | Discharge status: Home / Self Care | Payer: BC Managed Care – PPO | Source: Ambulatory Visit | Attending: General Surgery | Admitting: General Surgery

## 2013-04-07 ENCOUNTER — Encounter (HOSPITAL_COMMUNITY): Payer: Self-pay

## 2013-04-07 DIAGNOSIS — Z01812 Encounter for preprocedural laboratory examination: Secondary | ICD-10-CM | POA: Insufficient documentation

## 2013-04-07 DIAGNOSIS — Z01818 Encounter for other preprocedural examination: Secondary | ICD-10-CM | POA: Insufficient documentation

## 2013-04-07 LAB — CBC WITH DIFFERENTIAL/PLATELET
Eosinophils Absolute: 0.2 10*3/uL (ref 0.0–0.7)
Eosinophils Relative: 3 % (ref 0–5)
Lymphs Abs: 2 10*3/uL (ref 0.7–4.0)
MCH: 31.2 pg (ref 26.0–34.0)
MCHC: 35.2 g/dL (ref 30.0–36.0)
MCV: 88.5 fL (ref 78.0–100.0)
Platelets: 199 10*3/uL (ref 150–400)
RBC: 4.78 MIL/uL (ref 4.22–5.81)
RDW: 12.2 % (ref 11.5–15.5)

## 2013-04-07 LAB — BASIC METABOLIC PANEL
BUN: 13 mg/dL (ref 6–23)
CO2: 25 mEq/L (ref 19–32)
Calcium: 9.4 mg/dL (ref 8.4–10.5)
Creatinine, Ser: 0.95 mg/dL (ref 0.50–1.35)
Glucose, Bld: 110 mg/dL — ABNORMAL HIGH (ref 70–99)

## 2013-04-13 ENCOUNTER — Ambulatory Visit (HOSPITAL_COMMUNITY)
Admission: RE | Admit: 2013-04-13 | Discharge: 2013-04-13 | Disposition: A | Discharge status: Home / Self Care | Payer: BC Managed Care – PPO | Source: Ambulatory Visit | Attending: General Surgery | Admitting: General Surgery

## 2013-04-13 ENCOUNTER — Encounter (HOSPITAL_COMMUNITY): Payer: Self-pay | Admitting: Anesthesiology

## 2013-04-13 ENCOUNTER — Ambulatory Visit (HOSPITAL_COMMUNITY): Payer: BC Managed Care – PPO | Admitting: Anesthesiology

## 2013-04-13 ENCOUNTER — Encounter (HOSPITAL_COMMUNITY)
Admission: RE | Disposition: A | Discharge status: Home / Self Care | Payer: Self-pay | Source: Ambulatory Visit | Attending: General Surgery

## 2013-04-13 ENCOUNTER — Encounter (HOSPITAL_COMMUNITY): Payer: BC Managed Care – PPO | Admitting: Anesthesiology

## 2013-04-13 DIAGNOSIS — Z6829 Body mass index (BMI) 29.0-29.9, adult: Secondary | ICD-10-CM | POA: Insufficient documentation

## 2013-04-13 DIAGNOSIS — K432 Incisional hernia without obstruction or gangrene: Secondary | ICD-10-CM | POA: Insufficient documentation

## 2013-04-13 HISTORY — PX: INCISIONAL HERNIA REPAIR: SHX193

## 2013-04-13 SURGERY — REPAIR, HERNIA, INCISIONAL
Anesthesia: General | Site: Abdomen

## 2013-04-13 MED ORDER — SUCCINYLCHOLINE CHLORIDE 20 MG/ML IJ SOLN
INTRAMUSCULAR | Status: AC
  Filled 2013-04-13: qty 1

## 2013-04-13 MED ORDER — LACTATED RINGERS IV SOLN
INTRAVENOUS | Status: DC
  Administered 2013-04-13: 1000 mL via INTRAVENOUS

## 2013-04-13 MED ORDER — FENTANYL CITRATE 0.05 MG/ML IJ SOLN
25.0000 ug | INTRAMUSCULAR | Status: AC
  Administered 2013-04-13 (×2): 25 ug via INTRAVENOUS

## 2013-04-13 MED ORDER — CEFAZOLIN SODIUM-DEXTROSE 2-3 GM-% IV SOLR
2.0000 g | INTRAVENOUS | Status: AC
  Administered 2013-04-13: 2 g via INTRAVENOUS

## 2013-04-13 MED ORDER — FENTANYL CITRATE 0.05 MG/ML IJ SOLN
INTRAMUSCULAR | Status: DC | PRN
  Administered 2013-04-13: 25 ug via INTRAVENOUS
  Administered 2013-04-13: 50 ug via INTRAVENOUS
  Administered 2013-04-13 (×2): 25 ug via INTRAVENOUS
  Administered 2013-04-13: 50 ug via INTRAVENOUS
  Administered 2013-04-13: 25 ug via INTRAVENOUS
  Administered 2013-04-13: 50 ug via INTRAVENOUS

## 2013-04-13 MED ORDER — PROPOFOL 10 MG/ML IV EMUL
INTRAVENOUS | Status: AC
  Filled 2013-04-13: qty 20

## 2013-04-13 MED ORDER — MIDAZOLAM HCL 2 MG/2ML IJ SOLN
1.0000 mg | INTRAMUSCULAR | Status: DC | PRN
  Administered 2013-04-13 (×2): 1 mg via INTRAVENOUS

## 2013-04-13 MED ORDER — FENTANYL CITRATE 0.05 MG/ML IJ SOLN
INTRAMUSCULAR | Status: AC
  Filled 2013-04-13: qty 2

## 2013-04-13 MED ORDER — SODIUM CHLORIDE 0.9 % IR SOLN
Status: DC | PRN
  Administered 2013-04-13: 1000 mL

## 2013-04-13 MED ORDER — LACTATED RINGERS IV SOLN
INTRAVENOUS | Status: DC | PRN
  Administered 2013-04-13 (×2): via INTRAVENOUS

## 2013-04-13 MED ORDER — LIDOCAINE HCL (CARDIAC) 20 MG/ML IV SOLN
INTRAVENOUS | Status: DC | PRN
  Administered 2013-04-13: 50 mg via INTRAVENOUS

## 2013-04-13 MED ORDER — ONDANSETRON HCL 4 MG/2ML IJ SOLN: 4.0000 mg | Freq: Once | INTRAMUSCULAR | Status: DC | PRN

## 2013-04-13 MED ORDER — FENTANYL CITRATE 0.05 MG/ML IJ SOLN
INTRAMUSCULAR | Status: AC
  Filled 2013-04-13: qty 5

## 2013-04-13 MED ORDER — BUPIVACAINE HCL (PF) 0.5 % IJ SOLN
INTRAMUSCULAR | Status: AC
  Filled 2013-04-13: qty 30

## 2013-04-13 MED ORDER — BUPIVACAINE HCL (PF) 0.5 % IJ SOLN
INTRAMUSCULAR | Status: DC | PRN
  Administered 2013-04-13: 10 mL

## 2013-04-13 MED ORDER — OXYCODONE-ACETAMINOPHEN 7.5-325 MG PO TABS: 1.0000 | ORAL_TABLET | ORAL | Status: DC | PRN

## 2013-04-13 MED ORDER — CHLORHEXIDINE GLUCONATE 4 % EX LIQD: 1.0000 "application " | Freq: Once | CUTANEOUS | Status: DC

## 2013-04-13 MED ORDER — MIDAZOLAM HCL 2 MG/2ML IJ SOLN
INTRAMUSCULAR | Status: AC
  Filled 2013-04-13: qty 2

## 2013-04-13 MED ORDER — KETOROLAC TROMETHAMINE 30 MG/ML IJ SOLN
30.0000 mg | Freq: Once | INTRAMUSCULAR | Status: AC
  Administered 2013-04-13: 30 mg via INTRAVENOUS
  Filled 2013-04-13: qty 1

## 2013-04-13 MED ORDER — PROPOFOL 10 MG/ML IV BOLUS
INTRAVENOUS | Status: DC | PRN
  Administered 2013-04-13: 50 mg via INTRAVENOUS
  Administered 2013-04-13: 150 mg via INTRAVENOUS

## 2013-04-13 MED ORDER — LIDOCAINE HCL (PF) 1 % IJ SOLN
INTRAMUSCULAR | Status: AC
  Filled 2013-04-13: qty 5

## 2013-04-13 MED ORDER — CEFAZOLIN SODIUM-DEXTROSE 2-3 GM-% IV SOLR
INTRAVENOUS | Status: AC
  Filled 2013-04-13: qty 50

## 2013-04-13 MED ORDER — POVIDONE-IODINE 10 % OINT PACKET
TOPICAL_OINTMENT | CUTANEOUS | Status: DC | PRN
  Administered 2013-04-13: 1 via TOPICAL

## 2013-04-13 MED ORDER — FENTANYL CITRATE 0.05 MG/ML IJ SOLN
25.0000 ug | INTRAMUSCULAR | Status: DC | PRN
  Administered 2013-04-13 (×2): 50 ug via INTRAVENOUS

## 2013-04-13 MED ORDER — POVIDONE-IODINE 10 % EX OINT
TOPICAL_OINTMENT | CUTANEOUS | Status: AC
  Filled 2013-04-13: qty 1

## 2013-04-13 SURGICAL SUPPLY — 31 items
BAG HAMPER (MISCELLANEOUS) ×2 IMPLANT
CLOTH BEACON ORANGE TIMEOUT ST (SAFETY) ×2 IMPLANT
COVER LIGHT HANDLE STERIS (MISCELLANEOUS) ×4 IMPLANT
DECANTER SPIKE VIAL GLASS SM (MISCELLANEOUS) ×2 IMPLANT
DRSG TEGADERM 4X4.75 (GAUZE/BANDAGES/DRESSINGS) ×2 IMPLANT
DURAPREP 26ML APPLICATOR (WOUND CARE) ×2 IMPLANT
ELECT REM PT RETURN 9FT ADLT (ELECTROSURGICAL) ×2
ELECTRODE REM PT RTRN 9FT ADLT (ELECTROSURGICAL) ×1 IMPLANT
GLOVE BIOGEL PI IND STRL 7.0 (GLOVE) ×4 IMPLANT
GLOVE BIOGEL PI IND STRL 8 (GLOVE) ×1 IMPLANT
GLOVE BIOGEL PI INDICATOR 7.0 (GLOVE) ×4
GLOVE BIOGEL PI INDICATOR 8 (GLOVE) ×1
GLOVE ECLIPSE 6.5 STRL STRAW (GLOVE) ×6 IMPLANT
GLOVE ECLIPSE 7.5 STRL STRAW (GLOVE) ×2 IMPLANT
GLOVE EXAM NITRILE LRG STRL (GLOVE) ×2 IMPLANT
GOWN STRL REIN XL XLG (GOWN DISPOSABLE) ×8 IMPLANT
INST SET MAJOR GENERAL (KITS) ×2 IMPLANT
KIT ROOM TURNOVER APOR (KITS) ×2 IMPLANT
MANIFOLD NEPTUNE II (INSTRUMENTS) ×2 IMPLANT
MESH VENTRALEX ST 2.5 CRC MED (Mesh General) ×2 IMPLANT
NEEDLE HYPO 25X1 1.5 SAFETY (NEEDLE) ×2 IMPLANT
NS IRRIG 1000ML POUR BTL (IV SOLUTION) ×2 IMPLANT
PACK ABDOMINAL MAJOR (CUSTOM PROCEDURE TRAY) ×2 IMPLANT
PAD ARMBOARD 7.5X6 YLW CONV (MISCELLANEOUS) ×2 IMPLANT
SET BASIN LINEN APH (SET/KITS/TRAYS/PACK) ×2 IMPLANT
SPONGE GAUZE 4X4 12PLY (GAUZE/BANDAGES/DRESSINGS) ×2 IMPLANT
STAPLER VISISTAT (STAPLE) ×2 IMPLANT
SUT ETHIBOND NAB MO 7 #0 18IN (SUTURE) ×2 IMPLANT
SUT VIC AB 2-0 CT1 27 (SUTURE) ×1
SUT VIC AB 2-0 CT1 TAPERPNT 27 (SUTURE) ×1 IMPLANT
SYR CONTROL 10ML LL (SYRINGE) ×2 IMPLANT

## 2013-04-13 NOTE — Preoperative (Signed)
Beta Blockers   Reason not to administer Beta Blockers:Not Applicable 

## 2013-04-13 NOTE — Anesthesia Preprocedure Evaluation (Signed)
Anesthesia Evaluation  Patient identified by MRN, date of birth, ID band Patient awake    Reviewed: Allergy & Precautions, H&P , NPO status , Patient's Chart, lab work & pertinent test results  History of Anesthesia Complications Negative for: history of anesthetic complications  Airway Mallampati: II  TM Distance: >3 FB     Dental  (+) Teeth Intact   Pulmonary neg pulmonary ROS,  breath sounds clear to auscultation        Cardiovascular negative cardio ROS  Rhythm:Regular Rate:Normal     Neuro/Psych    GI/Hepatic negative GI ROS,   Endo/Other    Renal/GU      Musculoskeletal   Abdominal   Peds  Hematology   Anesthesia Other Findings   Reproductive/Obstetrics                             Anesthesia Physical Anesthesia Plan  ASA: I  Anesthesia Plan: General   Post-op Pain Management:    Induction: Intravenous  Airway Management Planned: LMA  Additional Equipment:   Intra-op Plan:   Post-operative Plan: Extubation in OR  Informed Consent: I have reviewed the patients History and Physical, chart, labs and discussed the procedure including the risks, benefits and alternatives for the proposed anesthesia with the patient or authorized representative who has indicated his/her understanding and acceptance.     Plan Discussed with:   Anesthesia Plan Comments:         Anesthesia Quick Evaluation  

## 2013-04-13 NOTE — Anesthesia Procedure Notes (Signed)
Procedure Name: LMA Insertion Date/Time: 04/13/2013 7:41 AM Performed by: Pernell Dupre, AMY A Pre-anesthesia Checklist: Patient identified, Timeout performed, Emergency Drugs available, Suction available and Patient being monitored Patient Re-evaluated:Patient Re-evaluated prior to inductionOxygen Delivery Method: Circle system utilized Intubation Type: IV induction Ventilation: Mask ventilation without difficulty LMA: LMA inserted LMA Size: 4.0 Number of attempts: 1 Placement Confirmation: positive ETCO2 and breath sounds checked- equal and bilateral Tube secured with: Tape Dental Injury: Teeth and Oropharynx as per pre-operative assessment

## 2013-04-13 NOTE — Transfer of Care (Signed)
Immediate Anesthesia Transfer of Care Note  Patient: Jesse Wilkins  Procedure(s) Performed: Procedure(s): HERNIA REPAIR INCISIONAL WITH MESH (N/A)  Patient Location: PACU  Anesthesia Type:General  Level of Consciousness: awake, alert , oriented and patient cooperative  Airway & Oxygen Therapy: Patient Spontanous Breathing and Patient connected to face mask oxygen  Post-op Assessment: Report given to PACU RN and Post -op Vital signs reviewed and stable  Post vital signs: Reviewed and stable  Complications: No apparent anesthesia complications

## 2013-04-13 NOTE — Interval H&P Note (Signed)
History and Physical Interval Note:  04/13/2013 7:27 AM  Jesse Wilkins  has presented today for surgery, with the diagnosis of incisional hernia   The various methods of treatment have been discussed with the patient and family. After consideration of risks, benefits and other options for treatment, the patient has consented to  Procedure(s): HERNIA REPAIR INCISIONAL WITH MESH (N/A) as a surgical intervention .  The patient's history has been reviewed, patient examined, no change in status, stable for surgery.  I have reviewed the patient's chart and labs.  Questions were answered to the patient's satisfaction.     Franky Macho A

## 2013-04-13 NOTE — Op Note (Signed)
Patient:  Jesse Wilkins  DOB:  November 16, 1970  MRN:  [id number]   Preop Diagnosis:  Incisional hernia  Postop Diagnosis:  Same  Procedure:  Incisional herniorrhaphy with mesh  Surgeon:  Franky Macho, M.D.  Anes:  General  Indications:  Patient is a 42 year old white male status post an umbilical herniorrhaphy with mesh in the past who now presents with a recurrent hernia just superior to the previous repair. The risks and benefits of the procedure including bleeding, infection, and a possibly recurrence the hernia were fully explained to the patient, who gave informed consent.  Procedure note:  The patient was placed in the supine position. After general anesthesia was administered, the abdomen was prepped and draped using usual sterile technique with DuraPrep. Surgical site for drainage was performed.  A midline incision was made just above the umbilicus. The dissection was taken down to the fascia. A hernia defect was found. The hernia sac was excised at difficulty. The omental contents were then reduced into the abdominal cavity. The previous mesh placement was noted to be intact. It appeared that this hernia developed just superior to the mesh. A 6.4 cm ventralax ST hernia patch was then inserted and secured with its straps to the fascia using 0 Ethibond interrupted sutures. The overlying fascia was then reapproximated transversely using 0 Ethibond interrupted sutures. The subcutaneous layer was reapproximated using a 2-0 Vicryl interrupted suture. The skin was closed using staples. 0.5% Sensorcaine was instilled the surrounding wound. Betadine ointment and dry sterile dressing were applied.  All tape and needle counts were correct the end of the procedure. Patient was awakened and transferred to PACU in stable condition.  Complications:  None  EBL:  Minimal  Specimen:  None

## 2013-04-13 NOTE — Anesthesia Postprocedure Evaluation (Signed)
  Anesthesia Post-op Note  Patient: Jesse Wilkins  Procedure(s) Performed: Procedure(s): HERNIA REPAIR INCISIONAL WITH MESH (N/A)  Patient Location: PACU  Anesthesia Type:General  Level of Consciousness: awake, alert , oriented and patient cooperative  Airway and Oxygen Therapy: Patient Spontanous Breathing and Patient connected to face mask oxygen  Post-op Pain: mild  Post-op Assessment: Post-op Vital signs reviewed, Patient's Cardiovascular Status Stable, Respiratory Function Stable, Patent Airway, No signs of Nausea or vomiting and Pain level controlled  Post-op Vital Signs: Reviewed and stable  Complications: No apparent anesthesia complications

## 2013-04-14 ENCOUNTER — Encounter (HOSPITAL_COMMUNITY): Payer: Self-pay | Admitting: General Surgery

## 2014-03-08 IMAGING — CT CT ABD-PELV W/ CM
2 of 4 series · 16 of 46 positions shown, 18 images · IV contrast (Omnipaque 300)
Comparison: None.

CLINICAL DATA: Umbilical hernia repair

CT ABDOMEN AND PELVIS WITH CONTRAST
TECHNIQUE: Multidetector CT imaging of the abdomen and pelvis was
performed following the standard protocol during bolus
administration of intravenous contrast.
Contrast: 100mL OMNIPAQUE IOHEXOL 300 MG/ML  SOLN

[Series 2: abd_pel_with 5.0 b40f · axial · 0.73mm/px · z∈[-436,-20]mm · 13 of 93 slices shown, 15 images]
[im 5/93  soft-tissue]
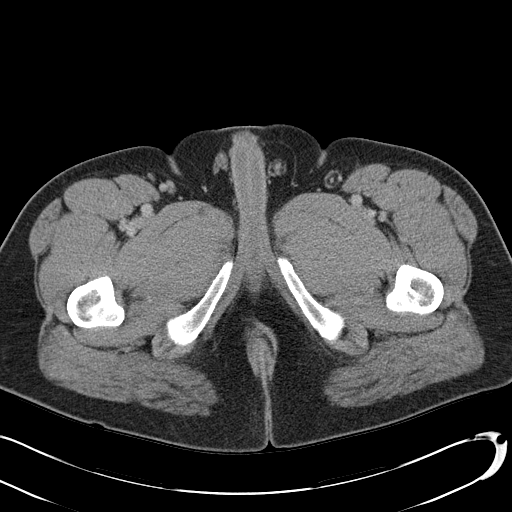
[im 5/93  bone]
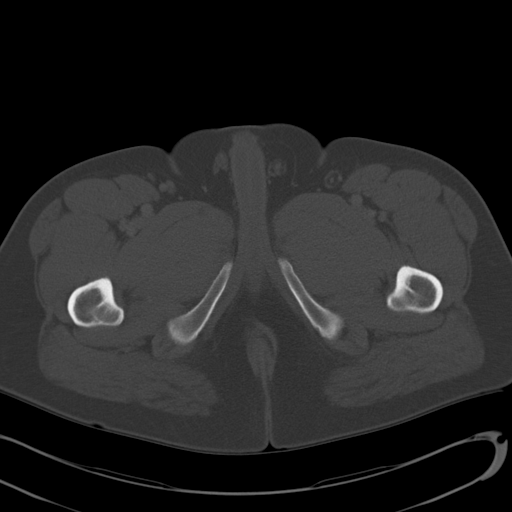
[im 14/93  soft-tissue]
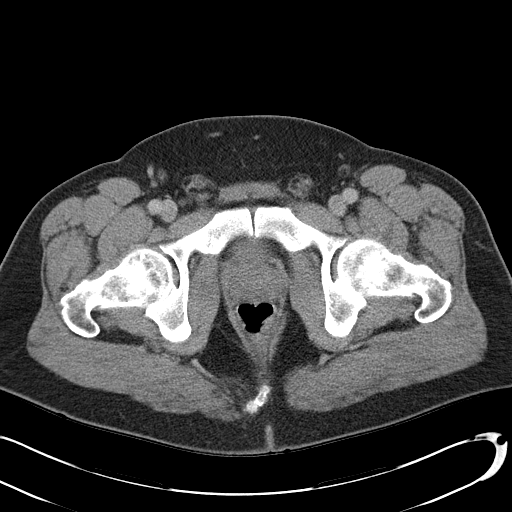
[im 19/93  soft-tissue]
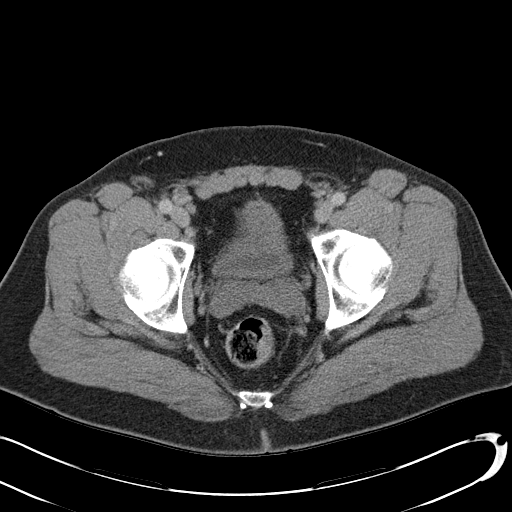
[im 28/93  soft-tissue]
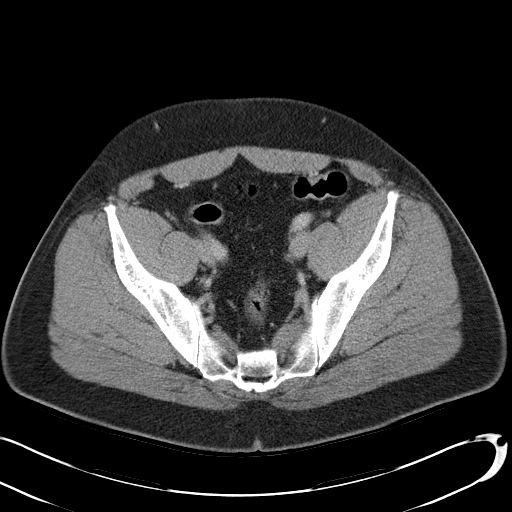
[im 33/93  soft-tissue]
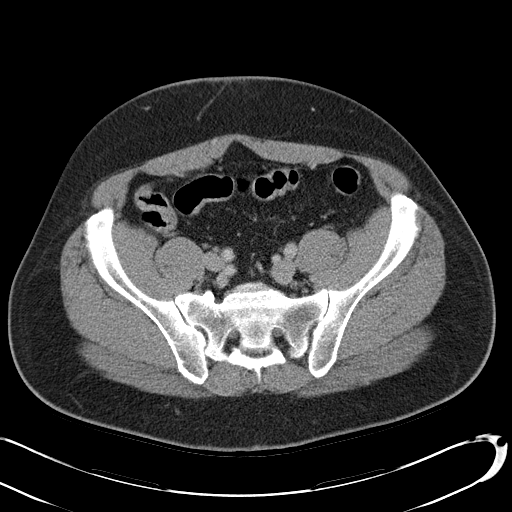
[im 42/93  soft-tissue]
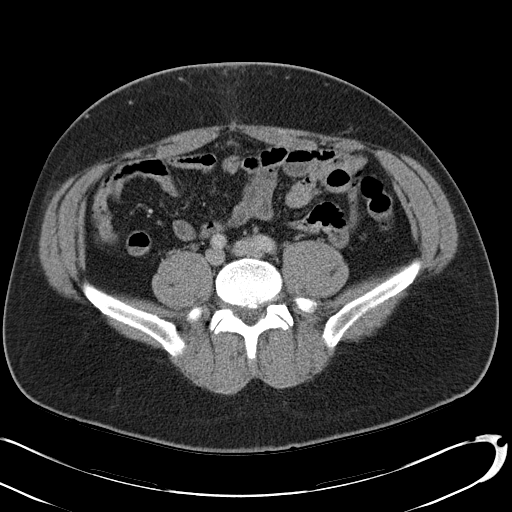
[im 47/93  soft-tissue]
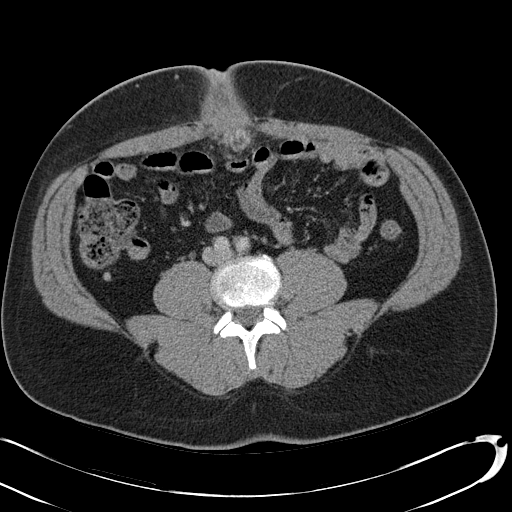
[im 51/93  soft-tissue]
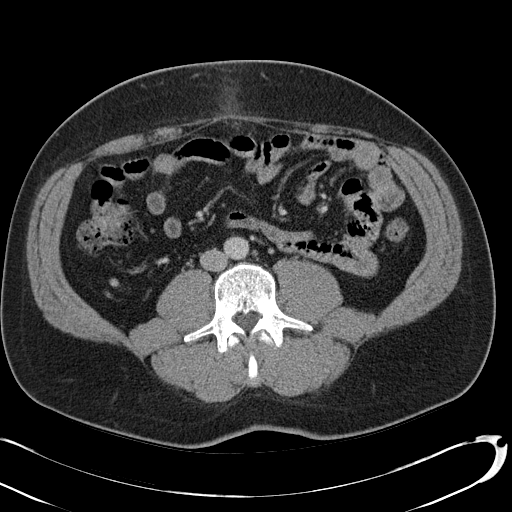
[im 60/93  soft-tissue]
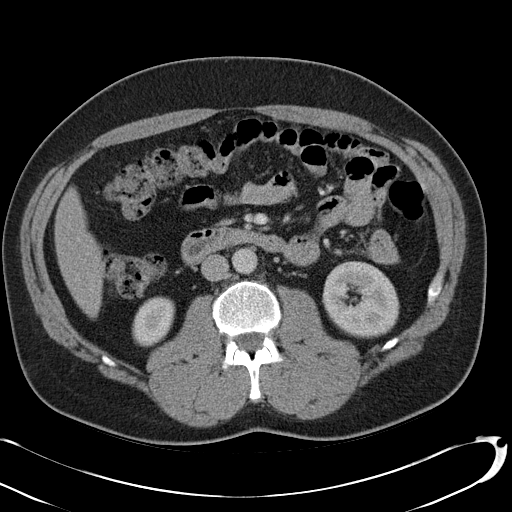
[im 60/93  bone]
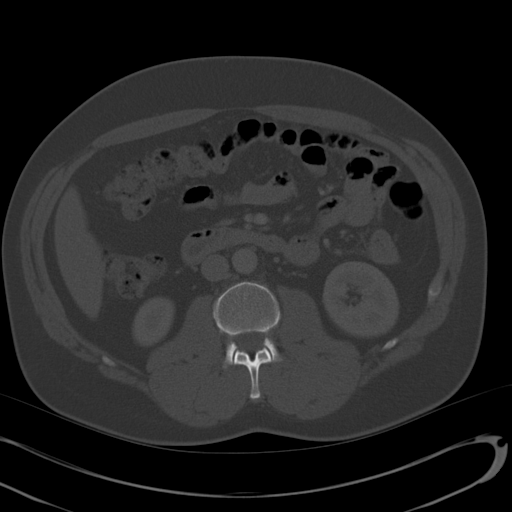
[im 65/93  soft-tissue]
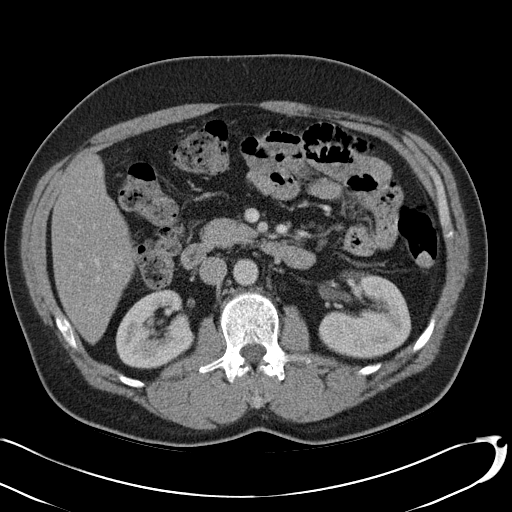
[im 74/93  soft-tissue]
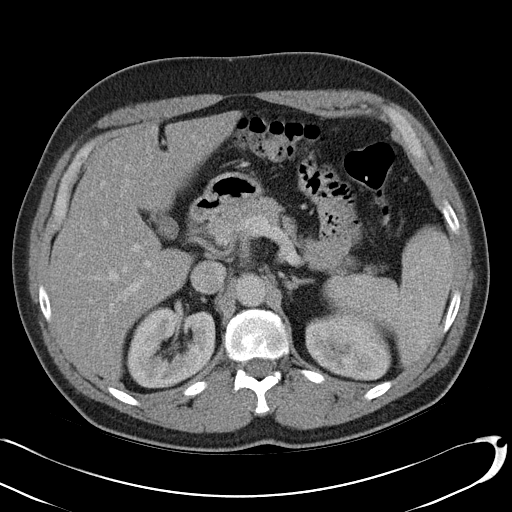
[im 79/93  soft-tissue]
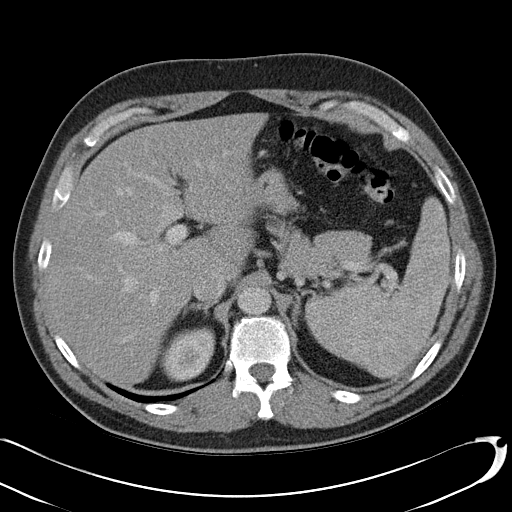
[im 88/93  soft-tissue]
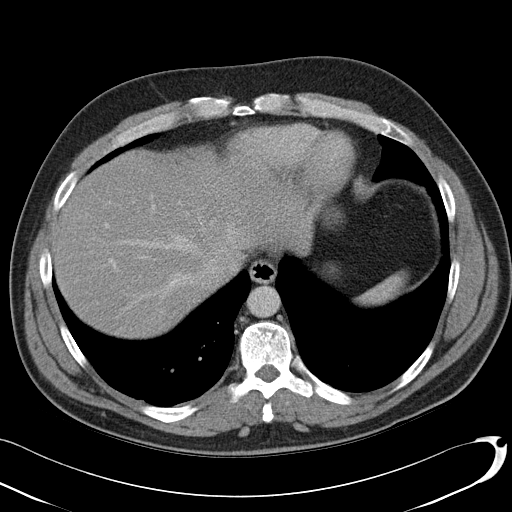

[Series 4: abd_pel_with 3.0 spo cor · coronal · 0.79mm/px · 3 of 83 slices shown]
[im 28/83  soft-tissue]
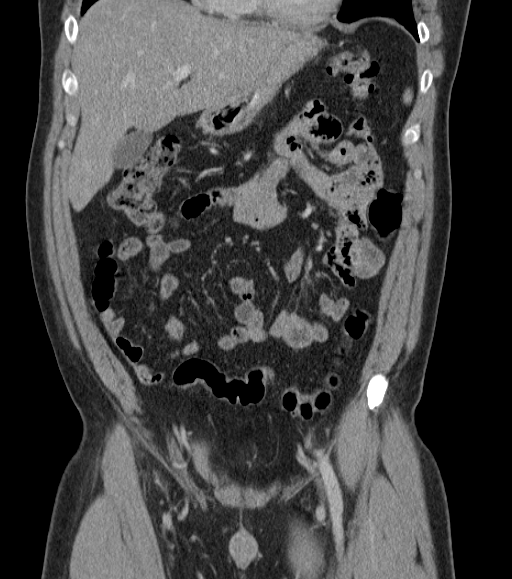
[im 37/83  soft-tissue]
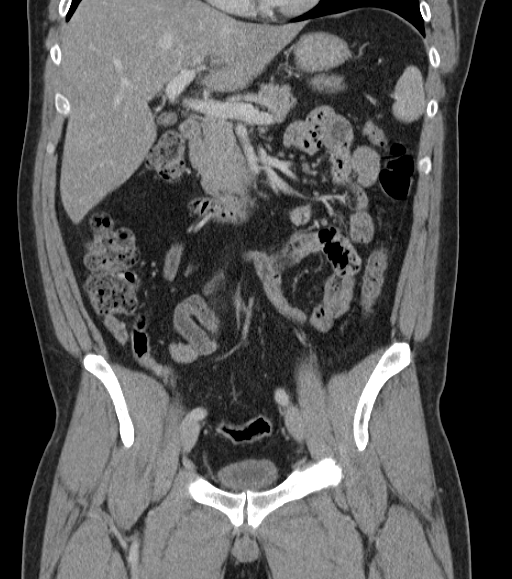
[im 46/83  soft-tissue]
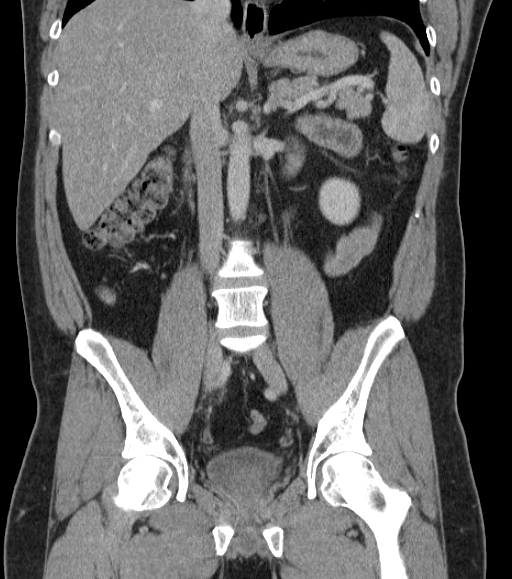

[16 of 46 positions shown; findings below may reference images not displayed]

FINDINGS: Postoperative changes from umbilical hernia repair are
present. There is no evidence of recurrent hernia.  At the hernia
site, within the anterior intraperitoneal space, just deep to the
hernia site, there is a small irregular round hyperdense structure
with a small amount of fluid centrally.  The fluid measures 1.5 x
0.6 x 0.9 cm and contains an air bubble.  This may epresent a small
amount of fluid within a hernia plug or patch for an inflammatory
process.  There is also a 8.8 x 1.8 x 0.9 cm fluid collection
within the subcutaneous fat at the hernia repair site containing a
few gas bubbles.  This does extend to within 1 cm of the scan.
Again, an inflammatory process or abscess cannot be excluded.

Diffuse hepatic steatosis.  The gallbladder is folded upon itself.

Spleen, pancreas, adrenal glands, kidneys are within normal limits.

Bladder, prostate, and seminal vesicles are within normal limits.

No free fluid within the pelvis.

No evidence of abnormal retroperitoneal adenopathy.  Small
mesenteric nodes are noted.  No evidence of bowel obstruction.

No vertebral compression deformity.  Lumbosacral spine is
transitional.
IMPRESSION: Status post umbilical hernia repair without evidence of recurrent
hernia.  There are two very small fluid collections deep and
superficial to the abdominal wall hernia repair site.  An
inflammatory process is not excluded.

## 2015-07-01 ENCOUNTER — Other Ambulatory Visit (HOSPITAL_COMMUNITY): Payer: Self-pay | Admitting: Internal Medicine

## 2015-07-01 ENCOUNTER — Ambulatory Visit (HOSPITAL_COMMUNITY)
Admission: RE | Admit: 2015-07-01 | Discharge: 2015-07-01 | Disposition: A | Discharge status: Home / Self Care | Payer: BLUE CROSS/BLUE SHIELD | Source: Ambulatory Visit | Attending: Internal Medicine | Admitting: Internal Medicine

## 2015-07-01 DIAGNOSIS — R319 Hematuria, unspecified: Secondary | ICD-10-CM | POA: Insufficient documentation

## 2015-07-01 DIAGNOSIS — R109 Unspecified abdominal pain: Secondary | ICD-10-CM | POA: Diagnosis present

## 2015-07-01 DIAGNOSIS — R1030 Lower abdominal pain, unspecified: Secondary | ICD-10-CM

## 2015-09-02 DIAGNOSIS — M545 Low back pain: Secondary | ICD-10-CM | POA: Diagnosis not present

## 2015-11-30 DIAGNOSIS — L309 Dermatitis, unspecified: Secondary | ICD-10-CM | POA: Diagnosis not present

## 2015-12-05 DIAGNOSIS — L309 Dermatitis, unspecified: Secondary | ICD-10-CM | POA: Diagnosis not present

## 2016-02-13 DIAGNOSIS — Z6829 Body mass index (BMI) 29.0-29.9, adult: Secondary | ICD-10-CM | POA: Diagnosis not present

## 2016-02-13 DIAGNOSIS — E6609 Other obesity due to excess calories: Secondary | ICD-10-CM | POA: Diagnosis not present

## 2016-02-13 DIAGNOSIS — Z23 Encounter for immunization: Secondary | ICD-10-CM | POA: Diagnosis not present

## 2016-04-09 DIAGNOSIS — J060 Acute laryngopharyngitis: Secondary | ICD-10-CM | POA: Diagnosis not present

## 2016-04-09 DIAGNOSIS — Z6828 Body mass index (BMI) 28.0-28.9, adult: Secondary | ICD-10-CM | POA: Diagnosis not present

## 2016-05-21 DIAGNOSIS — E6609 Other obesity due to excess calories: Secondary | ICD-10-CM | POA: Diagnosis not present

## 2016-05-21 DIAGNOSIS — Z713 Dietary counseling and surveillance: Secondary | ICD-10-CM | POA: Diagnosis not present

## 2016-05-21 DIAGNOSIS — Z6829 Body mass index (BMI) 29.0-29.9, adult: Secondary | ICD-10-CM | POA: Diagnosis not present

## 2017-01-23 DIAGNOSIS — Z23 Encounter for immunization: Secondary | ICD-10-CM | POA: Diagnosis not present

## 2017-03-04 DIAGNOSIS — E782 Mixed hyperlipidemia: Secondary | ICD-10-CM | POA: Diagnosis not present

## 2017-03-07 DIAGNOSIS — Z0000 Encounter for general adult medical examination without abnormal findings: Secondary | ICD-10-CM | POA: Diagnosis not present

## 2017-03-07 DIAGNOSIS — E663 Overweight: Secondary | ICD-10-CM | POA: Diagnosis not present

## 2017-03-07 DIAGNOSIS — Z6830 Body mass index (BMI) 30.0-30.9, adult: Secondary | ICD-10-CM | POA: Diagnosis not present

## 2017-03-07 DIAGNOSIS — E785 Hyperlipidemia, unspecified: Secondary | ICD-10-CM | POA: Diagnosis not present

## 2017-04-15 ENCOUNTER — Other Ambulatory Visit: Payer: Self-pay

## 2017-04-15 ENCOUNTER — Emergency Department (HOSPITAL_COMMUNITY): Payer: BLUE CROSS/BLUE SHIELD | Admitting: Anesthesiology

## 2017-04-15 ENCOUNTER — Encounter (HOSPITAL_COMMUNITY)
Admission: EM | Disposition: A | Discharge status: Home / Self Care | Payer: Self-pay | Source: Home / Self Care | Attending: Emergency Medicine

## 2017-04-15 ENCOUNTER — Encounter (HOSPITAL_COMMUNITY): Payer: Self-pay | Admitting: Emergency Medicine

## 2017-04-15 ENCOUNTER — Emergency Department (HOSPITAL_COMMUNITY)
Admission: EM | Admit: 2017-04-15 | Discharge: 2017-04-15 | Disposition: A | Discharge status: Home / Self Care | Payer: BLUE CROSS/BLUE SHIELD | Attending: General Surgery | Admitting: General Surgery

## 2017-04-15 ENCOUNTER — Emergency Department (HOSPITAL_COMMUNITY): Payer: BLUE CROSS/BLUE SHIELD

## 2017-04-15 DIAGNOSIS — R109 Unspecified abdominal pain: Secondary | ICD-10-CM

## 2017-04-15 DIAGNOSIS — R1011 Right upper quadrant pain: Secondary | ICD-10-CM | POA: Diagnosis not present

## 2017-04-15 DIAGNOSIS — K3580 Unspecified acute appendicitis: Secondary | ICD-10-CM

## 2017-04-15 DIAGNOSIS — K760 Fatty (change of) liver, not elsewhere classified: Secondary | ICD-10-CM | POA: Diagnosis not present

## 2017-04-15 HISTORY — PX: LAPAROSCOPIC APPENDECTOMY: SHX408

## 2017-04-15 LAB — COMPREHENSIVE METABOLIC PANEL
ALT: 39 U/L (ref 17–63)
AST: 30 U/L (ref 15–41)
Albumin: 4.4 g/dL (ref 3.5–5.0)
Alkaline Phosphatase: 42 U/L (ref 38–126)
Anion gap: 11 (ref 5–15)
BUN: 14 mg/dL (ref 6–20)
CO2: 25 mmol/L (ref 22–32)
CREATININE: 0.95 mg/dL (ref 0.61–1.24)
Calcium: 8.8 mg/dL — ABNORMAL LOW (ref 8.9–10.3)
Chloride: 102 mmol/L (ref 101–111)
GFR calc Af Amer: >60 mL/min (ref >60)
GFR calc non Af Amer: >60 mL/min (ref >60)
Glucose, Bld: 114 mg/dL — ABNORMAL HIGH (ref 65–99)
POTASSIUM: 3.9 mmol/L (ref 3.5–5.1)
SODIUM: 138 mmol/L (ref 135–145)
Total Bilirubin: 1.4 mg/dL — ABNORMAL HIGH (ref 0.3–1.2)
Total Protein: 7.2 g/dL (ref 6.5–8.1)

## 2017-04-15 LAB — URINALYSIS, ROUTINE W REFLEX MICROSCOPIC
Bilirubin Urine: NEGATIVE
Glucose, UA: NEGATIVE mg/dL
Hgb urine dipstick: NEGATIVE
Ketones, ur: NEGATIVE mg/dL
Leukocytes, UA: NEGATIVE
Nitrite: NEGATIVE
Protein, ur: NEGATIVE mg/dL
Specific Gravity, Urine: 1.01 (ref 1.005–1.030)
pH: 7 (ref 5.0–8.0)

## 2017-04-15 LAB — CBC
HCT: 45.7 % (ref 39.0–52.0)
Hemoglobin: 15.4 g/dL (ref 13.0–17.0)
MCH: 30.5 pg (ref 26.0–34.0)
MCHC: 33.7 g/dL (ref 30.0–36.0)
MCV: 90.5 fL (ref 78.0–100.0)
PLATELETS: 203 10*3/uL (ref 150–400)
RBC: 5.05 MIL/uL (ref 4.22–5.81)
RDW: 12.1 % (ref 11.5–15.5)
WBC: 14.2 10*3/uL — ABNORMAL HIGH (ref 4.0–10.5)

## 2017-04-15 LAB — LIPASE, BLOOD: LIPASE: 23 U/L (ref 11–51)

## 2017-04-15 SURGERY — APPENDECTOMY, LAPAROSCOPIC
Anesthesia: General

## 2017-04-15 MED ORDER — ONDANSETRON HCL 4 MG/2ML IJ SOLN
4.0000 mg | Freq: Once | INTRAMUSCULAR | Status: AC
  Administered 2017-04-15: 4 mg via INTRAVENOUS
  Filled 2017-04-15: qty 2

## 2017-04-15 MED ORDER — ONDANSETRON HCL 4 MG/2ML IJ SOLN
INTRAMUSCULAR | Status: DC | PRN
  Administered 2017-04-15: 4 mg via INTRAVENOUS

## 2017-04-15 MED ORDER — MIDAZOLAM HCL 2 MG/2ML IJ SOLN
INTRAMUSCULAR | Status: AC
  Filled 2017-04-15: qty 2

## 2017-04-15 MED ORDER — FENTANYL CITRATE (PF) 100 MCG/2ML IJ SOLN
INTRAMUSCULAR | Status: DC | PRN
  Administered 2017-04-15: 50 ug via INTRAVENOUS
  Administered 2017-04-15: 25 ug via INTRAVENOUS
  Administered 2017-04-15 (×2): 50 ug via INTRAVENOUS
  Administered 2017-04-15: 25 ug via INTRAVENOUS
  Administered 2017-04-15: 50 ug via INTRAVENOUS

## 2017-04-15 MED ORDER — BUPIVACAINE HCL 0.5 % IJ SOLN
INTRAMUSCULAR | Status: DC | PRN
  Administered 2017-04-15: 20 mL

## 2017-04-15 MED ORDER — FENTANYL CITRATE (PF) 100 MCG/2ML IJ SOLN
INTRAMUSCULAR | Status: AC
  Filled 2017-04-15: qty 2

## 2017-04-15 MED ORDER — LIDOCAINE HCL (CARDIAC) 10 MG/ML IV SOLN
INTRAVENOUS | Status: DC | PRN
  Administered 2017-04-15: 40 mg via INTRAVENOUS

## 2017-04-15 MED ORDER — ROCURONIUM 10MG/ML (10ML) SYRINGE FOR MEDFUSION PUMP - OPTIME
INTRAVENOUS | Status: DC | PRN
  Administered 2017-04-15: 20 mg via INTRAVENOUS
  Administered 2017-04-15: 10 mg via INTRAVENOUS
  Administered 2017-04-15: 25 mg via INTRAVENOUS
  Administered 2017-04-15: 5 mg via INTRAVENOUS

## 2017-04-15 MED ORDER — NEOSTIGMINE METHYLSULFATE 10 MG/10ML IV SOLN
INTRAVENOUS | Status: DC | PRN
  Administered 2017-04-15: 3.5 mg via INTRAVENOUS

## 2017-04-15 MED ORDER — ROCURONIUM BROMIDE 50 MG/5ML IV SOLN
INTRAVENOUS | Status: AC
  Filled 2017-04-15: qty 1

## 2017-04-15 MED ORDER — MORPHINE SULFATE (PF) 2 MG/ML IV SOLN: 2.0000 mg | INTRAVENOUS | Status: DC | PRN

## 2017-04-15 MED ORDER — ONDANSETRON HCL 4 MG/2ML IJ SOLN
INTRAMUSCULAR | Status: AC
  Filled 2017-04-15: qty 2

## 2017-04-15 MED ORDER — PIPERACILLIN-TAZOBACTAM 3.375 G IVPB 30 MIN
3.3750 g | Freq: Once | INTRAVENOUS | Status: AC
  Administered 2017-04-15: 3.375 g via INTRAVENOUS
  Filled 2017-04-15: qty 50

## 2017-04-15 MED ORDER — PROPOFOL 10 MG/ML IV BOLUS
INTRAVENOUS | Status: AC
  Filled 2017-04-15: qty 40

## 2017-04-15 MED ORDER — LACTATED RINGERS IV SOLN
INTRAVENOUS | Status: DC
Start: 2017-04-15 — End: 2017-04-15
  Administered 2017-04-15: 17:00:00 via INTRAVENOUS
  Administered 2017-04-15: 75 mL/h via INTRAVENOUS

## 2017-04-15 MED ORDER — FENTANYL CITRATE (PF) 100 MCG/2ML IJ SOLN
50.0000 ug | Freq: Once | INTRAMUSCULAR | Status: AC
  Administered 2017-04-15: 50 ug via INTRAVENOUS
  Filled 2017-04-15: qty 2

## 2017-04-15 MED ORDER — PROPOFOL 10 MG/ML IV BOLUS
INTRAVENOUS | Status: DC | PRN
  Administered 2017-04-15: 175 mg via INTRAVENOUS

## 2017-04-15 MED ORDER — SODIUM CHLORIDE 0.9 % IV BOLUS (SEPSIS)
1000.0000 mL | Freq: Once | INTRAVENOUS | Status: AC
  Administered 2017-04-15: 1000 mL via INTRAVENOUS

## 2017-04-15 MED ORDER — SUCCINYLCHOLINE 20MG/ML (10ML) SYRINGE FOR MEDFUSION PUMP - OPTIME
INTRAMUSCULAR | Status: DC | PRN
  Administered 2017-04-15: 120 mg via INTRAVENOUS

## 2017-04-15 MED ORDER — IOPAMIDOL (ISOVUE-300) INJECTION 61%
100.0000 mL | Freq: Once | INTRAVENOUS | Status: AC | PRN
Start: 2017-04-15 — End: 2017-04-15
  Administered 2017-04-15: 100 mL via INTRAVENOUS

## 2017-04-15 MED ORDER — GLYCOPYRROLATE 0.2 MG/ML IJ SOLN
INTRAMUSCULAR | Status: AC
  Filled 2017-04-15: qty 3

## 2017-04-15 MED ORDER — FENTANYL CITRATE (PF) 250 MCG/5ML IJ SOLN
INTRAMUSCULAR | Status: AC
  Filled 2017-04-15: qty 5

## 2017-04-15 MED ORDER — OXYCODONE HCL 5 MG PO TABS: 5.0000 mg | ORAL_TABLET | ORAL | 0 refills | Status: AC | PRN

## 2017-04-15 MED ORDER — CHLORHEXIDINE GLUCONATE CLOTH 2 % EX PADS
6.0000 | MEDICATED_PAD | Freq: Once | CUTANEOUS | Status: AC
  Administered 2017-04-15: 6 via TOPICAL

## 2017-04-15 MED ORDER — SODIUM CHLORIDE 0.9 % IV SOLN
1.0000 g | INTRAVENOUS | Status: AC
  Administered 2017-04-15: 1 g via INTRAVENOUS
  Filled 2017-04-15: qty 1

## 2017-04-15 MED ORDER — BUPIVACAINE HCL (PF) 0.5 % IJ SOLN
INTRAMUSCULAR | Status: AC
  Filled 2017-04-15: qty 30

## 2017-04-15 MED ORDER — NEOSTIGMINE METHYLSULFATE 10 MG/10ML IV SOLN
INTRAVENOUS | Status: AC
  Filled 2017-04-15: qty 1

## 2017-04-15 MED ORDER — SUCCINYLCHOLINE CHLORIDE 20 MG/ML IJ SOLN
INTRAMUSCULAR | Status: AC
  Filled 2017-04-15: qty 1

## 2017-04-15 MED ORDER — LIDOCAINE HCL (PF) 1 % IJ SOLN
INTRAMUSCULAR | Status: AC
  Filled 2017-04-15: qty 5

## 2017-04-15 MED ORDER — GLYCOPYRROLATE 0.2 MG/ML IJ SOLN
INTRAMUSCULAR | Status: DC | PRN
  Administered 2017-04-15: .6 mg via INTRAVENOUS

## 2017-04-15 MED ORDER — SODIUM CHLORIDE 0.9 % IR SOLN
Status: DC | PRN
  Administered 2017-04-15: 1000 mL
  Administered 2017-04-15: 3000 mL

## 2017-04-15 MED ORDER — OXYCODONE HCL 5 MG PO TABS: 5.0000 mg | ORAL_TABLET | Freq: Three times a day (TID) | ORAL | 0 refills | Status: DC | PRN

## 2017-04-15 MED ORDER — MIDAZOLAM HCL 5 MG/5ML IJ SOLN
INTRAMUSCULAR | Status: DC | PRN
  Administered 2017-04-15 (×2): 1 mg via INTRAVENOUS

## 2017-04-15 SURGICAL SUPPLY — 41 items
BAG HAMPER (MISCELLANEOUS) ×2 IMPLANT
BAG RETRIEVAL 10 (BASKET) ×1
BLADE SURG 15 STRL LF DISP TIS (BLADE) ×1 IMPLANT
BLADE SURG 15 STRL SS (BLADE) ×1
CHLORAPREP W/TINT 26ML (MISCELLANEOUS) ×2 IMPLANT
CLOTH BEACON ORANGE TIMEOUT ST (SAFETY) ×2 IMPLANT
COVER LIGHT HANDLE STERIS (MISCELLANEOUS) ×4 IMPLANT
CUTTER FLEX LINEAR 45M (STAPLE) ×2 IMPLANT
DECANTER SPIKE VIAL GLASS SM (MISCELLANEOUS) ×2 IMPLANT
DERMABOND ADVANCED (GAUZE/BANDAGES/DRESSINGS) ×1
DERMABOND ADVANCED .7 DNX12 (GAUZE/BANDAGES/DRESSINGS) ×1 IMPLANT
DEVICE TROCAR PUNCTURE CLOSURE (ENDOMECHANICALS) ×2 IMPLANT
ELECT REM PT RETURN 9FT ADLT (ELECTROSURGICAL) ×2
ELECTRODE REM PT RTRN 9FT ADLT (ELECTROSURGICAL) ×1 IMPLANT
GLOVE BIO SURGEON STRL SZ 6.5 (GLOVE) ×2 IMPLANT
GLOVE BIOGEL PI IND STRL 6.5 (GLOVE) ×1 IMPLANT
GLOVE BIOGEL PI INDICATOR 6.5 (GLOVE) ×1
GOWN STRL REUS W/TWL LRG LVL3 (GOWN DISPOSABLE) ×4 IMPLANT
INST SET LAPROSCOPIC AP (KITS) ×2 IMPLANT
IV NS IRRIG 3000ML ARTHROMATIC (IV SOLUTION) ×2 IMPLANT
KIT ROOM TURNOVER APOR (KITS) ×2 IMPLANT
LIGASURE 5MM LAPAROSCOPIC (INSTRUMENTS) ×2 IMPLANT
MANIFOLD NEPTUNE II (INSTRUMENTS) ×2 IMPLANT
NEEDLE INSUFFLATION 14GA 120MM (NEEDLE) ×2 IMPLANT
NS IRRIG 1000ML POUR BTL (IV SOLUTION) ×2 IMPLANT
PACK LAP CHOLE LZT030E (CUSTOM PROCEDURE TRAY) ×2 IMPLANT
PAD ARMBOARD 7.5X6 YLW CONV (MISCELLANEOUS) ×2 IMPLANT
RELOAD STAPLE TA45 3.5 REG BLU (ENDOMECHANICALS) ×2 IMPLANT
SET BASIN LINEN APH (SET/KITS/TRAYS/PACK) ×2 IMPLANT
SET TUBE IRRIG SUCTION NO TIP (IRRIGATION / IRRIGATOR) ×2 IMPLANT
SUT MNCRL AB 4-0 PS2 18 (SUTURE) ×2 IMPLANT
SUT VICRYL 0 UR6 27IN ABS (SUTURE) ×2 IMPLANT
SYS BAG RETRIEVAL 10MM (BASKET) ×1
SYSTEM BAG RETRIEVAL 10MM (BASKET) ×1 IMPLANT
TRAY FOLEY CATH 16FR SILVER (SET/KITS/TRAYS/PACK) ×2 IMPLANT
TROCAR ENDO BLADELESS 11MM (ENDOMECHANICALS) ×2 IMPLANT
TROCAR ENDO BLADELESS 12MM (ENDOMECHANICALS) ×2 IMPLANT
TROCAR XCEL NON-BLD 5MMX100MML (ENDOMECHANICALS) ×2 IMPLANT
TUBING INSUFFLATION (TUBING) ×2 IMPLANT
WARMER LAPAROSCOPE (MISCELLANEOUS) ×2 IMPLANT
YANKAUER SUCT 12FT TUBE ARGYLE (SUCTIONS) ×2 IMPLANT

## 2017-04-15 NOTE — H&P (Addendum)
[facility] History and Physical  Reason for Referral: Acute Appendicitis  Referring Physician: ED   Chief Complaint    Abdominal Pain      Jesse Wilkins is a 46 y.o. male.  HPI: Jesse Wilkins is a 46 yo man who presented with abdominal pain that started overnight. It was associated with bloating and some vomiting.  He reports that the pain is somewhat improved, but continues to have pain in the RLQ.  He was seen in the ED and found to have appendicitis on CT scan. His leukocytosis is to 16.  He otherwise is healthy, and has had an incisional hernia in the past repaired with mesh.   Past Medical History:  Diagnosis Date  . Medical history non-contributory     Past Surgical History:  Procedure Laterality Date  . adnoid    . INCISIONAL HERNIA REPAIR N/A 04/13/2013   Procedure: HERNIA REPAIR INCISIONAL WITH MESH;  Surgeon: Jamesetta So, MD;  Location: [facility];  Service: General;  Laterality: N/A;  . INSERTION OF MESH N/A 07/07/2012   Procedure: INSERTION OF MESH;  Surgeon: Donato Heinz, MD;  Location: [facility];  Service: General;  Laterality: N/A;  . UMBILICAL HERNIA REPAIR N/A 07/07/2012   Procedure: HERNIA REPAIR UMBILICAL ADULT;  Surgeon: Donato Heinz, MD;  Location: [facility];  Service: General;  Laterality: N/A;    History reviewed. No pertinent family history.  Social History   Tobacco Use  . Smoking status: Never Smoker  . Smokeless tobacco: Never Used  Substance Use Topics  . Alcohol use: No    Comment: occasionaly  . Drug use: No    Medications: I have reviewed the patient's current medications. Current Facility-Administered Medications  Medication Dose Route Frequency Provider Last Rate Last Dose  . Chlorhexidine Gluconate Cloth 2 % PADS 6 each  6 each Topical Once Virl Cagey, MD      . ertapenem [facility]) 1 g in sodium chloride 0.9 % 50 mL IVPB  1 g Intravenous On Call to OR Virl Cagey, MD      . piperacillin-tazobactam (ZOSYN)  IVPB 3.375 g  3.375 g Intravenous Once Francine Graven, DO       Current Outpatient Medications  Medication Sig Dispense Refill Last Dose  . ibuprofen (ADVIL,MOTRIN) 200 MG tablet Take 600 mg by mouth every 6 (six) hours as needed for pain.   Unknown at Unknown time  . mometasone (NASONEX) 50 MCG/ACT nasal spray Place 2 sprays into the nose daily as needed (nasal congestion).   Unknown at Unknown time  . Naphazoline HCl (CLEAR EYES OP) Apply 1 drop to eye daily as needed (dry eyes).   Unknown at Unknown time  . oxyCODONE-acetaminophen (PERCOCET) 7.5-325 MG per tablet Take 1-2 tablets by mouth every 4 (four) hours as needed. 30 tablet 0   . Pseudoephedrine HCl (SUDAFED PO) Take 1 tablet by mouth daily as needed (sinus).   Past Week at Unknown time  . zolpidem (AMBIEN) 10 MG tablet Take 10 mg by mouth at bedtime as needed for sleep.   Past Week at Unknown time    ROS:  A comprehensive review of systems was negative except for: Gastrointestinal: positive for abdominal pain, nausea and bloating  Blood pressure 134/86, pulse 76, temperature 98.7 F (37.1 C), temperature source Oral, resp. rate 18, height 5' 9"  (1.753 m), weight 200 lb (90.7 kg), SpO2 93 %. Physical Exam  Constitutional: He is oriented to person, place, and time  and well-developed, well-nourished, and in no distress.  HENT:  Head: Normocephalic.  Eyes: Pupils are equal, round, and reactive to light.  Neck: Normal range of motion.  Cardiovascular: Normal rate and regular rhythm.  Pulmonary/Chest: Effort normal.  Abdominal: Soft. He exhibits no distension. There is tenderness in the right lower quadrant. There is no rebound and no guarding.  Musculoskeletal: Normal range of motion.  Neurological: He is alert and oriented to person, place, and time.  Skin: Skin is warm and dry.  Psychiatric: Mood, memory, affect and judgment normal.  Vitals reviewed.   Results: Results for orders placed or performed during the hospital  encounter of 04/15/17 (from the past 48 hour(s))  Urinalysis, Routine w reflex microscopic     Status: None   Collection Time: 04/15/17  8:09 AM  Result Value Ref Range   Color, Urine YELLOW YELLOW   APPearance CLEAR CLEAR   Specific Gravity, Urine 1.010 1.005 - 1.030   pH 7.0 5.0 - 8.0   Glucose, UA NEGATIVE NEGATIVE mg/dL   Hgb urine dipstick NEGATIVE NEGATIVE   Bilirubin Urine NEGATIVE NEGATIVE   Ketones, ur NEGATIVE NEGATIVE mg/dL   Protein, ur NEGATIVE NEGATIVE mg/dL   Nitrite NEGATIVE NEGATIVE   Leukocytes, UA NEGATIVE NEGATIVE  Lipase, blood     Status: None   Collection Time: 04/15/17  8:26 AM  Result Value Ref Range   Lipase 23 11 - 51 U/L  Comprehensive metabolic panel     Status: Abnormal   Collection Time: 04/15/17  8:26 AM  Result Value Ref Range   Sodium 138 135 - 145 mmol/L   Potassium 3.9 3.5 - 5.1 mmol/L   Chloride 102 101 - 111 mmol/L   CO2 25 22 - 32 mmol/L   Glucose, Bld 114 (H) 65 - 99 mg/dL   BUN 14 6 - 20 mg/dL   Creatinine, Ser 0.95 0.61 - 1.24 mg/dL   Calcium 8.8 (L) 8.9 - 10.3 mg/dL   Total Protein 7.2 6.5 - 8.1 g/dL   Albumin 4.4 3.5 - 5.0 g/dL   AST 30 15 - 41 U/L   ALT 39 17 - 63 U/L   Alkaline Phosphatase 42 38 - 126 U/L   Total Bilirubin 1.4 (H) 0.3 - 1.2 mg/dL   GFR calc non Af Amer >60 >60 mL/min   GFR calc Af Amer >60 >60 mL/min    Comment: (NOTE) The eGFR has been calculated using the CKD EPI equation. This calculation has not been validated in all clinical situations. eGFR's persistently <60 mL/min signify possible Chronic Kidney Disease.    Anion gap 11 5 - 15  CBC     Status: Abnormal   Collection Time: 04/15/17  8:26 AM  Result Value Ref Range   WBC 14.2 (H) 4.0 - 10.5 K/uL   RBC 5.05 4.22 - 5.81 MIL/uL   Hemoglobin 15.4 13.0 - 17.0 g/dL   HCT 45.7 39.0 - 52.0 %   MCV 90.5 78.0 - 100.0 fL   MCH 30.5 26.0 - 34.0 pg   MCHC 33.7 30.0 - 36.0 g/dL   RDW 12.1 11.5 - 15.5 %   Platelets 203 150 - 400 K/uL    Ct Abdomen Pelvis W  Contrast  Result Date: 04/15/2017 CLINICAL DATA:  Upper abdominal pain since last night. Bloating and emesis. EXAM: CT ABDOMEN AND PELVIS WITH CONTRAST TECHNIQUE: Multidetector CT imaging of the abdomen and pelvis was performed using the standard protocol following bolus administration of intravenous contrast. CONTRAST:  129m  ISOVUE-300 IOPAMIDOL (ISOVUE-300) INJECTION 61% COMPARISON:  07/01/2015 FINDINGS: Lower chest:  No contributory findings. Hepatobiliary: Hepatic steatosis.No evidence of biliary obstruction or stone. Pancreas: Unremarkable. Spleen: Unremarkable. Adrenals/Urinary Tract: Negative adrenals. No hydronephrosis or stone. Unremarkable bladder. Stomach/Bowel: Appendix thickening and mesoappendix inflammation. Maximal outer wall thickness is 1 cm. No visible perforation or abscess. The appendix is retrocecal and flexed. Vascular/Lymphatic: No acute vascular abnormality. No mass or adenopathy. Reproductive:No pathologic findings. Other: Previous umbilical hernia repair. Musculoskeletal: No acute abnormalities. These results were called by telephone at the time of interpretation on 04/15/2017 at 12:00 pm to Dr. Kennith Maes , who verbally acknowledged these results. IMPRESSION: 1. Acute appendicitis without abscess or visible perforation. 2. Hepatic steatosis. Electronically Signed   By: Monte Fantasia M.D.   On: 04/15/2017 12:01   US Abdomen Limited Ruq  Result Date: 04/15/2017 CLINICAL DATA:  Abdominal pain. EXAM: ULTRASOUND ABDOMEN LIMITED RIGHT UPPER QUADRANT COMPARISON:  None. FINDINGS: Gallbladder: No gallstones or wall thickening visualized. No sonographic Murphy sign noted by sonographer. Common bile duct: Diameter: 3 mm Liver: Probable hepatic steatosis. Portal vein is patent on color Doppler imaging with normal direction of blood flow towards the liver. IMPRESSION: 1. No acute abnormalities.  Probable hepatic steatosis. Electronically Signed   By: Dorise Bullion III M.D   On:  04/15/2017 09:42     Assessment & Plan:  BALEY LORIMER is a 46 y.o. male with acute appendicitis with pain since last night. No other issues otherwise. He is feeling better. He last ate prior to midnight.  -OR for lap appy -Risk and benefits discussed including bleeding, infection, need for further surgeries, or larger surgery if inflamed versus antibiotics and drainage  -NPO -Invanz now   All questions were answered to the satisfaction of the patient and family.    Virl Cagey 04/15/2017, 12:28 PM

## 2017-04-15 NOTE — ED Notes (Signed)
OR team evaluating pt.  Given abx to hang.

## 2017-04-15 NOTE — Discharge Instructions (Signed)
Discharge Instructions: Shower per your regular routine. Take tylenol and ibuprofen as needed for pain control, alternating every 4-6 hours.  Take Roxicodone for breakthrough pain. Take colace for constipation related to narcotic pain medication. Do not pick at the dermabond glue on your incision sites.   Laparoscopic Appendectomy, Adult, Care After These instructions give you information about caring for yourself after your procedure. Your doctor may also give you more specific instructions. Call your doctor if you have any problems or questions after your procedure. Follow these instructions at home: Medicines  Take over-the-counter and prescription medicines only as told by your doctor.  Do not drive for 24 hours if you received a sedative.  Do not drive or use heavy machinery while taking prescription pain medicine.  If you were prescribed an antibiotic medicine, take it as told by your doctor. Do not stop taking it even if you start to feel better. Activity  Do not lift anything that is heavier than 10 pounds (4.5 kg) for 3 weeks or as told by your doctor.  Do not play contact sports for 3 weeks or as told by your doctor.  Slowly return to your normal activities. Bathing  Keep your cuts from surgery (incisions) clean and dry. ? Gently wash the cuts with soap and water. ? Rinse the cuts with water until the soap is gone. ? Pat the cuts dry with a clean towel. Do not rub the cuts.  You may take showers after 48 hours.  Do not take baths, swim, or use a hot tub for 2 weeks or as told by your doctor. Cut Care  Follow instructions from your doctor about how to take care of your cuts. Make sure you: ? Wash your hands with soap and water before you change your bandage (dressing). If you do not have soap and water, use hand sanitizer. ? Change your bandage as told by your doctor. ? Leave stitches (sutures), skin glue, or skin tape (adhesive) strips in place. They may need to  stay in place for 2 weeks or longer. If tape strips get loose and curl up, you may trim the loose edges. Do not remove tape strips completely unless your doctor says it is okay.  Check your cuts every day for signs of infection. Check for: ? More redness, swelling, or pain. ? More fluid or blood. ? Warmth. ? Pus or a bad smell. Other Instructions  If you were sent home with a drain, follow instructions from your doctor about how to use it and care for it.  Take deep breaths. This helps to keep your lungs from getting swollen (inflamed).  To help with constipation: ? Drink plenty of fluids. ? Eat plenty of fruits and vegetables.  Keep all follow-up visits as told by your doctor. This is important. Contact a doctor if:  You have more redness, swelling, or pain around a cut from surgery.  You have more fluid or blood coming from a cut.  Your cut feels warm to the touch.  You have pus or a bad smell coming from a cut or a bandage.  The edges of a cut break open after the stitches have been taken out.  You have pain in your shoulders that gets worse.  You feel dizzy or you pass out (faint).  You have shortness of breath.  You keep feeling sick to your stomach (nauseous).  You keep throwing up (vomiting).  You get diarrhea or you cannot control your poop.  You lose  your appetite.  You have swelling or pain in your legs. Get help right away if:  You have a fever.  You get a rash.  You have trouble breathing.  You have sharp pains in your chest. This information is not intended to replace advice given to you by your health care provider. Make sure you discuss any questions you have with your health care provider. Document Released: 02/03/2009 Document Revised: 09/15/2015 Document Reviewed: 09/27/2014 Elsevier Interactive Patient Education  2018 [employer].

## 2017-04-15 NOTE — Anesthesia Procedure Notes (Signed)
Procedure Name: Intubation Date/Time: 04/15/2017 3:54 PM Performed by: Ollen Bowl, CRNA Pre-anesthesia Checklist: Patient identified, Patient being monitored, Timeout performed, Emergency Drugs available and Suction available Patient Re-evaluated:Patient Re-evaluated prior to induction Oxygen Delivery Method: Circle system utilized Preoxygenation: Pre-oxygenation with 100% oxygen Induction Type: IV induction, Cricoid Pressure applied and Rapid sequence Ventilation: Mask ventilation without difficulty Laryngoscope Size: Mac and 3 Grade View: Grade I Tube type: Oral Tube size: 7.0 mm Number of attempts: 1 Airway Equipment and Method: Stylet Placement Confirmation: ETT inserted through vocal cords under direct vision,  positive ETCO2 and breath sounds checked- equal and bilateral Secured at: 21 cm Tube secured with: Tape Dental Injury: Teeth and Oropharynx as per pre-operative assessment

## 2017-04-15 NOTE — Anesthesia Preprocedure Evaluation (Signed)
Anesthesia Evaluation  Patient identified by MRN, date of birth, ID band Patient awake  General Assessment Comment:Not on a beta blocker  Reviewed: Allergy & Precautions, NPO status , Patient's Chart, lab work & pertinent test results  History of Anesthesia Complications Negative for: history of anesthetic complications  Airway Mallampati: II  TM Distance: >3 FB Neck ROM: Full    Dental no notable dental hx. (+) Dental Advisory Given,    Pulmonary neg pulmonary ROS,    breath sounds clear to auscultation       Cardiovascular negative cardio ROS   Rhythm:Regular Rate:Normal     Neuro/Psych negative neurological ROS  negative psych ROS   GI/Hepatic Neg liver ROS,   Endo/Other  negative endocrine ROS  Renal/GU negative Renal ROS     Musculoskeletal negative musculoskeletal ROS (+)   Abdominal   Peds  Hematology negative hematology ROS (+)   Anesthesia Other Findings   Reproductive/Obstetrics                             Anesthesia Physical Anesthesia Plan  ASA: I and emergent  Anesthesia Plan: General   Post-op Pain Management:    Induction: Intravenous, Rapid sequence and Cricoid pressure planned  PONV Risk Score and Plan:   Airway Management Planned: Oral ETT  Additional Equipment:   Intra-op Plan:   Post-operative Plan: Extubation in OR  Informed Consent:   Plan Discussed with: Surgeon  Anesthesia Plan Comments:         Anesthesia Quick Evaluation

## 2017-04-15 NOTE — ED Triage Notes (Signed)
Pt reports upper abd pain since last night. Pt reports emesis, bloating sensation since this am. Pt reports took otc medication with minimal relief. Pt reports history of hiatel hernia repair. Last BM this am.

## 2017-04-15 NOTE — Anesthesia Postprocedure Evaluation (Signed)
Anesthesia Post Note  Patient: MICHAEL WALRATH  Procedure(s) Performed: LAPAROSCOPIC APPENDECTOMY (N/A )  Patient location during evaluation: PACU Anesthesia Type: General Level of consciousness: awake and alert and oriented Pain management: pain level controlled Vital Signs Assessment: post-procedure vital signs reviewed and stable Respiratory status: spontaneous breathing Cardiovascular status: blood pressure returned to baseline and stable Postop Assessment: no apparent nausea or vomiting Anesthetic complications: no     Last Vitals:  Vitals:   04/15/17 1720 04/15/17 1730  BP: (!) 147/79 130/73  Pulse: 94 85  Resp: 13 20  Temp: (!) 38.2 C   SpO2: 100% 98%    Last Pain:  Vitals:   04/15/17 1730  TempSrc:   PainSc: Asleep                 Francile Woolford

## 2017-04-15 NOTE — ED Provider Notes (Signed)
[facility] EMERGENCY DEPARTMENT Provider Note   CSN: [id number] Arrival date & time: 04/15/17  0744     History   Chief Complaint Chief Complaint  Patient presents with  . Abdominal Pain    HPI Jesse Wilkins is a 46 y.o. male with a history of umbilical hernia repair and hiatal hernia repair who presents the emergency department complaining of abdominal pain that started yesterday at 1800.  Patient describes the pain as being sharp and to the epigastric region. Reports quick onset of pain while playing with his son. Took Gasex, pain persisted, but eased off throughout the night.  This morning patient woke up at 0400 with worsened pain, tried Ibuprofen and Peptobismol without relief. Rates pain at present an 8/10 in severity.  Experiencing associated nausea and dry heaving, no bloody emesis. Has had one normal BM since onset of pain. Denies fever, chills, diarrhea, constipation, dysuria, or testicular pain/swelling. No recent increase in alcohol or NSAIDs. Last PO intake was last night prior to midnight.   HPI  Past Medical History:  Diagnosis Date  . Medical history non-contributory     There are no active problems to display for this patient.   Past Surgical History:  Procedure Laterality Date  . adnoid    . INCISIONAL HERNIA REPAIR N/A 04/13/2013   Procedure: HERNIA REPAIR INCISIONAL WITH MESH;  Surgeon: Jamesetta So, MD;  Location: [facility];  Service: General;  Laterality: N/A;  . INSERTION OF MESH N/A 07/07/2012   Procedure: INSERTION OF MESH;  Surgeon: Donato Heinz, MD;  Location: [facility];  Service: General;  Laterality: N/A;  . UMBILICAL HERNIA REPAIR N/A 07/07/2012   Procedure: HERNIA REPAIR UMBILICAL ADULT;  Surgeon: Donato Heinz, MD;  Location: [facility];  Service: General;  Laterality: N/A;       Home Medications    Prior to Admission medications   Medication Sig Start Date End Date Taking? Authorizing Provider  ibuprofen (ADVIL,MOTRIN) 200 MG tablet Take  600 mg by mouth every 6 (six) hours as needed for pain.    [provider]  mometasone (NASONEX) 50 MCG/ACT nasal spray Place 2 sprays into the nose daily as needed (nasal congestion).    [provider]  Naphazoline HCl (CLEAR EYES OP) Apply 1 drop to eye daily as needed (dry eyes).    [provider]  oxyCODONE-acetaminophen (PERCOCET) 7.5-325 MG per tablet Take 1-2 tablets by mouth every 4 (four) hours as needed. 04/13/13   Aviva Signs, MD  Pseudoephedrine HCl (SUDAFED PO) Take 1 tablet by mouth daily as needed (sinus).    [provider]  zolpidem (AMBIEN) 10 MG tablet Take 10 mg by mouth at bedtime as needed for sleep.    [provider]    Family History History reviewed. No pertinent family history.  Social History Social History   Tobacco Use  . Smoking status: Never Smoker  . Smokeless tobacco: Never Used  Substance Use Topics  . Alcohol use: No    Comment: occasionaly  . Drug use: No     Allergies   Patient has no known allergies.   Review of Systems Review of Systems  Constitutional: Negative for chills and fever.  HENT: Negative for congestion, ear pain and sore throat.   Eyes: Negative for visual disturbance.  Respiratory: Negative for cough and shortness of breath.   Cardiovascular: Negative for chest pain.  Gastrointestinal: Positive for abdominal pain, nausea and vomiting ("dry heaving"). Negative for blood in stool,  constipation and diarrhea.  Genitourinary: Negative for dysuria, scrotal swelling and testicular pain.  Musculoskeletal: Negative for back pain.  Skin: Negative for rash.  Neurological: Negative for weakness and numbness.  All other systems reviewed and are negative.  Physical Exam Updated Vital Signs BP 134/86 (BP Location: Right Arm)   Pulse 76   Temp 98.7 F (37.1 C) (Oral)   Resp 18   Ht 5\' 9"  (1.753 m)   Wt 90.7 kg (200 lb)   SpO2 93%   BMI 29.53 kg/m   Physical Exam    Constitutional: He appears well-developed and well-nourished.  Non-toxic appearance.  HENT:  Head: Normocephalic and atraumatic.  Eyes: Conjunctivae are normal. Right eye exhibits no discharge. Left eye exhibits no discharge.  Cardiovascular: Normal rate and regular rhythm.  No murmur heard. Pulmonary/Chest: Breath sounds normal. No respiratory distress. He has no wheezes. He has no rales.  Abdominal: Soft. Bowel sounds are normal. He exhibits no distension. There is tenderness (RUQ). There is no rigidity, no rebound, no guarding, no CVA tenderness and no tenderness at McBurney's point.  Neurological: He is alert.  Clear speech.   Skin: Skin is warm and dry. No rash noted.  Psychiatric: He has a normal mood and affect. His behavior is normal.  Nursing note and vitals reviewed.    ED Treatments / Results  Labs Results for orders placed or performed during the hospital encounter of 04/15/17  Lipase, blood  Result Value Ref Range   Lipase 23 11 - 51 U/L  Comprehensive metabolic panel  Result Value Ref Range   Sodium 138 135 - 145 mmol/L   Potassium 3.9 3.5 - 5.1 mmol/L   Chloride 102 101 - 111 mmol/L   CO2 25 22 - 32 mmol/L   Glucose, Bld 114 (H) 65 - 99 mg/dL   BUN 14 6 - 20 mg/dL   Creatinine, Ser 0.95 0.61 - 1.24 mg/dL   Calcium 8.8 (L) 8.9 - 10.3 mg/dL   Total Protein 7.2 6.5 - 8.1 g/dL   Albumin 4.4 3.5 - 5.0 g/dL   AST 30 15 - 41 U/L   ALT 39 17 - 63 U/L   Alkaline Phosphatase 42 38 - 126 U/L   Total Bilirubin 1.4 (H) 0.3 - 1.2 mg/dL   GFR calc non Af Amer >60 >60 mL/min   GFR calc Af Amer >60 >60 mL/min   Anion gap 11 5 - 15  CBC  Result Value Ref Range   WBC 14.2 (H) 4.0 - 10.5 K/uL   RBC 5.05 4.22 - 5.81 MIL/uL   Hemoglobin 15.4 13.0 - 17.0 g/dL   HCT 45.7 39.0 - 52.0 %   MCV 90.5 78.0 - 100.0 fL   MCH 30.5 26.0 - 34.0 pg   MCHC 33.7 30.0 - 36.0 g/dL   RDW 12.1 11.5 - 15.5 %   Platelets 203 150 - 400 K/uL  Urinalysis, Routine w reflex microscopic  Result  Value Ref Range   Color, Urine YELLOW YELLOW   APPearance CLEAR CLEAR   Specific Gravity, Urine 1.010 1.005 - 1.030   pH 7.0 5.0 - 8.0   Glucose, UA NEGATIVE NEGATIVE mg/dL   Hgb urine dipstick NEGATIVE NEGATIVE   Bilirubin Urine NEGATIVE NEGATIVE   Ketones, ur NEGATIVE NEGATIVE mg/dL   Protein, ur NEGATIVE NEGATIVE mg/dL   Nitrite NEGATIVE NEGATIVE   Leukocytes, UA NEGATIVE NEGATIVE     EKG Interpretation None      Radiology Ct Abdomen Pelvis W Contrast  Result Date: 04/15/2017 CLINICAL DATA:  Upper abdominal pain since last night. Bloating and emesis. EXAM: CT ABDOMEN AND PELVIS WITH CONTRAST TECHNIQUE: Multidetector CT imaging of the abdomen and pelvis was performed using the standard protocol following bolus administration of intravenous contrast. CONTRAST:  180mL ISOVUE-300 IOPAMIDOL (ISOVUE-300) INJECTION 61% COMPARISON:  07/01/2015 FINDINGS: Lower chest:  No contributory findings. Hepatobiliary: Hepatic steatosis.No evidence of biliary obstruction or stone. Pancreas: Unremarkable. Spleen: Unremarkable. Adrenals/Urinary Tract: Negative adrenals. No hydronephrosis or stone. Unremarkable bladder. Stomach/Bowel: Appendix thickening and mesoappendix inflammation. Maximal outer wall thickness is 1 cm. No visible perforation or abscess. The appendix is retrocecal and flexed. Vascular/Lymphatic: No acute vascular abnormality. No mass or adenopathy. Reproductive:No pathologic findings. Other: Previous umbilical hernia repair. Musculoskeletal: No acute abnormalities. These results were called by telephone at the time of interpretation on 04/15/2017 at 12:00 pm to Dr. Kennith Maes , who verbally acknowledged these results. IMPRESSION: 1. Acute appendicitis without abscess or visible perforation. 2. Hepatic steatosis. Electronically Signed   By: Monte Fantasia M.D.   On: 04/15/2017 12:01   US Abdomen Limited Ruq  Result Date: 04/15/2017 CLINICAL DATA:  Abdominal pain. EXAM: ULTRASOUND  ABDOMEN LIMITED RIGHT UPPER QUADRANT COMPARISON:  None. FINDINGS: Gallbladder: No gallstones or wall thickening visualized. No sonographic Murphy sign noted by sonographer. Common bile duct: Diameter: 3 mm Liver: Probable hepatic steatosis. Portal vein is patent on color Doppler imaging with normal direction of blood flow towards the liver. IMPRESSION: 1. No acute abnormalities.  Probable hepatic steatosis. Electronically Signed   By: Dorise Bullion III M.D   On: 04/15/2017 09:42   Procedures Procedures (including critical care time)  Medications Ordered in ED Medications  ondansetron (ZOFRAN) injection 4 mg (not administered)  fentaNYL (SUBLIMAZE) injection 50 mcg (not administered)  sodium chloride 0.9 % bolus 1,000 mL (not administered)     Initial Impression / Assessment and Plan / ED Course  I have reviewed the triage vital signs and the nursing notes.  Pertinent labs & imaging results that were available during my care of the patient were reviewed by me and considered in my medical decision making (see chart for details).  Patient presents with complaint of epigastric pain with associated nausea and dry heaving. Patient is nontoxic appearing with stable vital signs. Tenderness in RUQ on exam, no rigidity/rebound/guarding. Will evaluate with screening labs and RUQ [place].   Patient without acute findings on [place], WBC elevated at 14.2, labs otherwise grossly unremarkable, will evaluate with CT abdomen/pelvis.     10:10: Re-eval: Patient's pain and nausea significantly improved. Discussed [place] (including hepatic steatosis) and lab results thus far and plan for CT scan with patient and his wife.   12:00: Consult: Discussed case with Radiologist Dr. Pascal Lux, CT scan revealed appendicitis without abscess or perforation.   12:05: Discussed CT results with patient and his wife.    12:09: Consult: Discussed case with general surgeon Dr. Constance Haw who will come to the emergency department to see the  patient.   Dr. Constance Haw will take patient to the operating room today from ED.    Final Clinical Impressions(s) / ED Diagnoses   Final diagnoses:  Abdominal pain  Acute appendicitis, uncomplicated    ED Discharge Orders    None       Amaryllis Dyke, PA-C 04/15/17 [place], [place], DO 04/17/17 1918

## 2017-04-15 NOTE — Transfer of Care (Signed)
Immediate Anesthesia Transfer of Care Note  Patient: Jesse Wilkins  Procedure(s) Performed: LAPAROSCOPIC APPENDECTOMY (N/A )  Patient Location: PACU  Anesthesia Type:General  Level of Consciousness: awake and alert   Airway & Oxygen Therapy: Patient Spontanous Breathing and Patient connected to face mask oxygen  Post-op Assessment: Report given to RN  Post vital signs: Reviewed and stable  Last Vitals:  Vitals:   04/15/17 1244 04/15/17 1720  BP: 109/76 (!) 147/79  Pulse: 72 94  Resp: 18 13  Temp: 37 C (!) 38.2 C  SpO2: 96% 100%    Last Pain:  Vitals:   04/15/17 1244  TempSrc: Oral  PainSc:          Complications: No apparent anesthesia complications

## 2017-04-15 NOTE — Op Note (Signed)
[facility]  Date of Surgery: 04/15/2017  Admit Date: 04/15/2017   Performing Service: General  Surgeon(s) and Role:    Virl Cagey, MD   Pre-operative Diagnosis: Acute Appendicitis  Post-operative Diagnosis: Acute Appendicitis  Procedure Performed: Laparoscopic Appendectomy   Surgeon: Lanell Matar. Constance Haw, MD   Assistant: No qualified resident was available.   Anesthesia: General   Findings:  The appendix was found to be inflamed. There were no signs of necrosis. There was not perforation. There was not abscess formation.   Estimated Blood Loss: Minimal   Specimens:  ID Type Source Tests Collected by Time Destination  1 : Appendix GI Appendix SURGICAL PATHOLOGY Virl Cagey, MD 83/15/1761 [id number]      Complications: None; patient tolerated the procedure well.  Hematoma of the subcutaneous tissue by the left lower quadrant trocar site, marked.   Disposition: Short Stay   Condition: stable   Indications: The patient presented with a 1 day history of right-sided abdominal pain. A CT scan revealed findings consistent with acute appendicitis.  He had been having some nausea and vomiting in addition to the abdominal pain.   Procedure Details  Prior to the procedure, the risks, benefits, complications, treatment options, and expected outcomes were discussed with the patient and/or family, including but not limited to the risk of bleeding, infection, finding of a normal appendix, and the need for conversion to an open procedure. There was concurrence with the proposed plan and informed consent was obtained. The patient was taken to the operating room, identified as Jesse Wilkins and the procedure verified as Laproscopic Appendectomy.    The patient was placed in the supine position and general anesthesia was induced, along with placement of orogastric tube, SCD's, and a Foley catheter. An OG was placed.  The abdomen was prepped and draped in a  sterile fashion. The patient had a prior incisional hernia repair above the umbilicus and an umbilical hernia repair below the umbilicus. Given this, a vertical incision was made above the prior incision and the abdominal wall was elevated with a towel clip. The Veress technique was attempted in this region, but adequate drop technique was not established, so the abdomen was entered with Veress technique in the left upper quadrant. Intraperitoneal placement was confirmed with saline drop, low entry pressures, and easy insufflation. A 11 mm optiview trocar was placed under direct visualization with a 0 degree scope at the supraumbilical incision. The 10 mm 0 degree scope was placed in the abdomen and no evidence of injury was identified. A 12 mm port was placed in the left lower quadrant of the abdomen after skin incision with trocar placement under direct vision. A careful evaluation of the entire abdomen was carried out. An additional 5 mm port was placed in the suprapubic area under direct vision.  The patient was placed in Trendelenburg and left lateral decubitus position. The small intestines were retracted in the cephalad and left lateral direction away from the pelvis and right lower quadrant. The patient was found to have an retrocecal appendix that wrapped around the inferior edge of the cecum appendix. There was not evidence of perforation.   The appendix was carefully dissected.  A window was made in the mesoappendix at the base of the appendix. The appendix was divided at its base using another tan endo-GIA stapler. Minimal appendiceal stump was left in place. The mesoappendix was taken with the Ligasure energy device as the appendix traversed around the edge of the  cecum.  Minor peritoneal attachments to the cecum were divided with the Ligasure to allow for adequate mobilization.  The appendix was placed within an Endocatch specimen bag. There was no evidence of bleeding, leakage, or complication after  division of the appendix. The area was suctioned and irrigated to ensure no bleeding.   Hemostasis was confirmed. The endocatch bag was removed via the 12 mm port, then the abdomen desufflated. The appendix was passed off the field as a specimen.   The the 12 mm and 10 mm port sites were closed with a 0 Vicryl suture. The trocar site skin wounds were closed using subcuticular 4-0 Monocryl suture and dermabond. The left lower quadrant port site was noted to have a subcutaneous hematoma. Pressure was held on the area and the area was marked.  The patient was then awakened from general anesthesia, extubated, and taken to PACU for recovery.   Instrument, sponge, and needle counts were correct at the conclusion of the case.   Curlene Labrum, MD [facility] [address] [place], [place] [address] [phone] (office)

## 2017-04-16 ENCOUNTER — Telehealth: Payer: Self-pay | Admitting: General Surgery

## 2017-04-16 NOTE — Telephone Encounter (Signed)
12/24 @ 10:47pm Wife called last night regarding fever. Jesse Wilkins was having temp to 101. Giving ibuprofen but no tylenol. Otherwise well but sore.  Recommended alternating tylenol and ibuprofen, and deep breathing.   12/25 @ 10:30Am  Called patient wife back this AM. Better. Fever broke. Got Tylenol at about 1130pm. Having shoulder pain. Recommended ambulating to help with gas.  Curlene Labrum, MD [facility] [address] [place], [place] [address] [phone] (office)

## 2017-04-17 LAB — URINE CULTURE: Culture: NO GROWTH

## 2017-04-18 ENCOUNTER — Encounter (HOSPITAL_COMMUNITY): Payer: Self-pay | Admitting: General Surgery

## 2017-04-18 ENCOUNTER — Telehealth: Payer: Self-pay | Admitting: General Surgery

## 2017-04-18 NOTE — Telephone Encounter (Signed)
[facility]  Called patient. He is doing well. NO pain. No fevers. Incisions healing. Feels good. Pathology acute appendicitis and no malignancy. Notified him of these results.  [place] and will not be able to follow up until after 1/15. I spoke with him and told him it is ok. And if things are fine and he is doing well and has no issues, it is ok if he does not follow back as I have told him pathology.   Told him what to look out for as far as drainage, redness, fevers, RLQ pain, chills. He is aware of issues to get medical attention.  Curlene Labrum, MD [facility] [address] [place], [place] [address] [phone] (office)

## 2017-04-24 ENCOUNTER — Encounter (HOSPITAL_COMMUNITY): Payer: Self-pay | Admitting: General Surgery

## 2017-05-02 ENCOUNTER — Encounter (HOSPITAL_COMMUNITY): Payer: Self-pay | Admitting: General Surgery

## 2017-05-07 ENCOUNTER — Ambulatory Visit (INDEPENDENT_AMBULATORY_CARE_PROVIDER_SITE_OTHER): Payer: Self-pay | Admitting: General Surgery

## 2017-05-07 ENCOUNTER — Encounter: Payer: Self-pay | Admitting: General Surgery

## 2017-05-07 VITALS — BP 131/82 | HR 67 | Temp 98.4°F | Resp 18 | Ht 69.0 in | Wt 210.0 lb

## 2017-05-07 DIAGNOSIS — K3580 Unspecified acute appendicitis: Secondary | ICD-10-CM

## 2017-05-07 DIAGNOSIS — U071 COVID-19: Secondary | ICD-10-CM

## 2017-05-07 DIAGNOSIS — Z6831 Body mass index (BMI) 31.0-31.9, adult: Secondary | ICD-10-CM

## 2017-05-07 NOTE — Progress Notes (Signed)
[facility] Note   HPI:  47 y.o. Male presents to clinic for post-op follow-up evaluation after a laparoscopic appendectomy. Patient reports he is doing well. He went on a work trip to [place]. He has been having no pain. He did have fevers the night post op, but those broke with tylenol.  Review of Systems:  No fevers or chills Tolerating diet  All other review of systems: otherwise negative   Pathology: Appendix, Other than Incidental - ACUTE APPENDICITIS - NO MALIGNANCY IDENTIFIED  Vital Signs:  BP 131/82   Pulse 67   Temp 98.4 F (36.9 C)   Resp 18   Ht 5\' 9"  (1.753 m)   Wt 210 lb (95.3 kg)   BMI 31.01 kg/m    Physical Exam:  Physical Exam  Constitutional: He is well-developed, well-nourished, and in no distress.  Cardiovascular: Normal rate.  Pulmonary/Chest: Effort normal.  Abdominal: Soft. He exhibits no distension. There is no tenderness.  Port sites healing, no erythema or drainage  Vitals reviewed.   Laboratory studies: None   Imaging:  None   Assessment:  47 y.o. yo Male s/p laparoscopic appendectomy for acute appendicitis. He is doing well. Feels good.  Plan:  - Activity as tolerated   - Follow up PRN    All of the above recommendations were discussed with the patient, and all of patient's questions were answered to his expressed satisfaction.  Curlene Labrum, MD [facility] [address] [place], [place] [address] [phone] (office)

## 2017-06-17 DIAGNOSIS — Z6831 Body mass index (BMI) 31.0-31.9, adult: Secondary | ICD-10-CM | POA: Diagnosis not present

## 2017-06-17 DIAGNOSIS — E6609 Other obesity due to excess calories: Secondary | ICD-10-CM | POA: Diagnosis not present

## 2017-09-05 DIAGNOSIS — Z6831 Body mass index (BMI) 31.0-31.9, adult: Secondary | ICD-10-CM | POA: Diagnosis not present

## 2017-09-05 DIAGNOSIS — E6609 Other obesity due to excess calories: Secondary | ICD-10-CM | POA: Diagnosis not present

## 2017-10-30 DIAGNOSIS — Z6831 Body mass index (BMI) 31.0-31.9, adult: Secondary | ICD-10-CM | POA: Diagnosis not present

## 2017-10-30 DIAGNOSIS — R634 Abnormal weight loss: Secondary | ICD-10-CM | POA: Diagnosis not present

## 2017-10-30 DIAGNOSIS — E6609 Other obesity due to excess calories: Secondary | ICD-10-CM | POA: Diagnosis not present

## 2017-11-14 DIAGNOSIS — E6609 Other obesity due to excess calories: Secondary | ICD-10-CM | POA: Diagnosis not present

## 2017-11-14 DIAGNOSIS — Z6831 Body mass index (BMI) 31.0-31.9, adult: Secondary | ICD-10-CM | POA: Diagnosis not present

## 2018-02-07 DIAGNOSIS — Z23 Encounter for immunization: Secondary | ICD-10-CM | POA: Diagnosis not present

## 2018-02-28 DIAGNOSIS — E663 Overweight: Secondary | ICD-10-CM | POA: Diagnosis not present

## 2018-02-28 DIAGNOSIS — Z6830 Body mass index (BMI) 30.0-30.9, adult: Secondary | ICD-10-CM | POA: Diagnosis not present

## 2018-02-28 DIAGNOSIS — Z0000 Encounter for general adult medical examination without abnormal findings: Secondary | ICD-10-CM | POA: Diagnosis not present

## 2018-02-28 DIAGNOSIS — Z23 Encounter for immunization: Secondary | ICD-10-CM | POA: Diagnosis not present

## 2018-02-28 DIAGNOSIS — E785 Hyperlipidemia, unspecified: Secondary | ICD-10-CM | POA: Diagnosis not present

## 2018-03-07 DIAGNOSIS — Z23 Encounter for immunization: Secondary | ICD-10-CM | POA: Diagnosis not present

## 2018-03-07 DIAGNOSIS — J309 Allergic rhinitis, unspecified: Secondary | ICD-10-CM | POA: Diagnosis not present

## 2018-03-07 DIAGNOSIS — Z0001 Encounter for general adult medical examination with abnormal findings: Secondary | ICD-10-CM | POA: Diagnosis not present

## 2018-03-07 DIAGNOSIS — E782 Mixed hyperlipidemia: Secondary | ICD-10-CM | POA: Diagnosis not present

## 2018-06-24 DIAGNOSIS — J019 Acute sinusitis, unspecified: Secondary | ICD-10-CM | POA: Diagnosis not present

## 2018-06-24 DIAGNOSIS — H6502 Acute serous otitis media, left ear: Secondary | ICD-10-CM | POA: Diagnosis not present

## 2018-06-24 DIAGNOSIS — Z713 Dietary counseling and surveillance: Secondary | ICD-10-CM | POA: Diagnosis not present

## 2018-12-16 DIAGNOSIS — Z23 Encounter for immunization: Secondary | ICD-10-CM | POA: Diagnosis not present

## 2018-12-16 DIAGNOSIS — Z20828 Contact with and (suspected) exposure to other viral communicable diseases: Secondary | ICD-10-CM | POA: Diagnosis not present

## 2018-12-16 DIAGNOSIS — J019 Acute sinusitis, unspecified: Secondary | ICD-10-CM | POA: Diagnosis not present

## 2018-12-16 DIAGNOSIS — H6502 Acute serous otitis media, left ear: Secondary | ICD-10-CM | POA: Diagnosis not present

## 2018-12-16 DIAGNOSIS — Z0000 Encounter for general adult medical examination without abnormal findings: Secondary | ICD-10-CM | POA: Diagnosis not present

## 2019-01-21 DIAGNOSIS — Z23 Encounter for immunization: Secondary | ICD-10-CM | POA: Diagnosis not present

## 2019-03-04 DIAGNOSIS — E785 Hyperlipidemia, unspecified: Secondary | ICD-10-CM | POA: Diagnosis not present

## 2019-03-04 DIAGNOSIS — E782 Mixed hyperlipidemia: Secondary | ICD-10-CM | POA: Diagnosis not present

## 2019-03-10 DIAGNOSIS — Z713 Dietary counseling and surveillance: Secondary | ICD-10-CM | POA: Diagnosis not present

## 2019-03-10 DIAGNOSIS — J309 Allergic rhinitis, unspecified: Secondary | ICD-10-CM | POA: Diagnosis not present

## 2019-03-10 DIAGNOSIS — Z0001 Encounter for general adult medical examination with abnormal findings: Secondary | ICD-10-CM | POA: Diagnosis not present

## 2019-03-10 DIAGNOSIS — E782 Mixed hyperlipidemia: Secondary | ICD-10-CM | POA: Diagnosis not present

## 2019-06-11 DIAGNOSIS — Z6829 Body mass index (BMI) 29.0-29.9, adult: Secondary | ICD-10-CM | POA: Diagnosis not present

## 2019-06-11 DIAGNOSIS — E669 Obesity, unspecified: Secondary | ICD-10-CM | POA: Diagnosis not present

## 2019-06-11 DIAGNOSIS — Z713 Dietary counseling and surveillance: Secondary | ICD-10-CM | POA: Diagnosis not present

## 2020-01-28 DIAGNOSIS — U071 COVID-19: Secondary | ICD-10-CM | POA: Diagnosis not present

## 2020-01-29 ENCOUNTER — Telehealth: Payer: Self-pay | Admitting: Family

## 2020-01-29 NOTE — Telephone Encounter (Signed)
Called to Discuss with patient about Covid symptoms and the use of the monoclonal antibody infusion for those with mild to moderate Covid symptoms and at a high risk of hospitalization.     Pt appears to qualify for this infusion due to co-morbid conditions and/or a member of an at-risk group in accordance with the FDA Emergency Use Authorization.   Jesse Wilkins symptoms started on 10/1 and continues to have fatigue, headaches, congestion, cough, fever and chills. Tested positive on a home test and through [facility] PCR.  Qualifying risk factor includes BMI> 25.  Spoke with Jesse Wilkins and his wife regarding the risks and benefits of treatment with Regeneron and he wishes to continue with treatment.   Hello Jesse Wilkins,   You have been scheduled to receive Regeneron (the monoclonal antibody we discussed) on : 01/30/20 at 9:30am  If you have been tested outside of a [facility] - you MUST bring a copy of your positive test with you the morning of your appointment. You may take a photo of this and upload to your MyChart portal or have the testing facility fax the result to [phone]    The address for the infusion clinic site is:  --GPS address is [place] - the parking is located near [employer] building where you will see  COVID19 Infusion feather banner marking the entrance to parking.   (see photos below)            --Enter into the 2nd entrance where the "wave, flag banner" is at the road. Turn into this 2nd entrance and immediately turn left to park in 1 of the 5 parking spots.   --Please stay in your car and call the desk for assistance inside [phone].   --Average time in department is roughly 2 hours for Regeneron treatment - this includes preparation of the medication, IV start and the required 1 hour monitoring after the infusion.    Should you develop worsening shortness of breath, chest pain or severe breathing problems please do not wait for  this appointment and go to the Emergency room for evaluation and treatment. You will undergo another oxygen screen before your infusion to ensure this is the best treatment option for you. There is a chance that the best decision may be to send you to the Emergency Room for evaluation at the time of your appointment.   The day of your visit you should: Marland Kitchen Get plenty of rest the night before and drink plenty of water . Eat a light meal/snack before coming and take your medications as prescribed  . Wear warm, comfortable clothes with a shirt that can roll-up over the elbow (will need IV start).  . Wear a mask  . Consider bringing some activity to help pass the time  Many commercial insurers are waiving bills related to [place] treatment however some have ranged from $[phone]. We are starting to see some insurers send bills to patients later for the administration of the medication - we are learning more information but you may receive a bill after your appointment.  Please contact your insurance agent to discuss prior to your appointment if you would like further details about billing specific to your policy.    The CPT code is [phone] for your reference.    Terri Piedra, NP 01/29/2020 2:13 PM

## 2020-01-29 NOTE — Progress Notes (Unsigned)
I connected by phone with Jesse Wilkins on 01/29/2020 at 2:16 PM to discuss the potential use of a new treatment for mild to moderate COVID-19 viral infection in non-hospitalized patients.  This patient is a 49 y.o. male that meets the FDA criteria for Emergency Use Authorization of COVID monoclonal antibody casirivimab/imdevimab or bamlanivimab/eteseviamb.  Has a (+) direct SARS-CoV-2 viral test result  Has mild or moderate COVID-19   Is NOT hospitalized due to COVID-19  Is within 10 days of symptom onset  Has at least one of the high risk factor(s) for progression to severe COVID-19 and/or hospitalization as defined in EUA.  Specific high risk criteria : BMI > 25   I have spoken and communicated the following to the patient or parent/caregiver regarding COVID monoclonal antibody treatment:  1. FDA has authorized the emergency use for the treatment of mild to moderate COVID-19 in adults and pediatric patients with positive results of direct SARS-CoV-2 viral testing who are 12 years of age and older weighing at least 40 kg, and who are at high risk for progressing to severe COVID-19 and/or hospitalization.  2. The significant known and potential risks and benefits of COVID monoclonal antibody, and the extent to which such potential risks and benefits are unknown.  3. Information on available alternative treatments and the risks and benefits of those alternatives, including clinical trials.  4. Patients treated with COVID monoclonal antibody should continue to self-isolate and use infection control measures (e.g., wear mask, isolate, social distance, avoid sharing personal items, clean and disinfect "high touch" surfaces, and frequent handwashing) according to CDC guidelines.   5. The patient or parent/caregiver has the option to accept or refuse COVID monoclonal antibody treatment.  After reviewing this information with the patient, the patient has agreed to receive one of the  available covid 19 monoclonal antibodies and will be provided an appropriate fact sheet prior to infusion.   Mauricio Po, FNP 01/29/2020 2:16 PM

## 2020-01-30 ENCOUNTER — Other Ambulatory Visit (HOSPITAL_COMMUNITY): Payer: Self-pay

## 2020-01-30 ENCOUNTER — Other Ambulatory Visit: Payer: Self-pay

## 2020-01-30 ENCOUNTER — Ambulatory Visit (HOSPITAL_COMMUNITY)
Admission: RE | Admit: 2020-01-30 | Discharge: 2020-01-30 | Disposition: A | Discharge status: Home / Self Care | Payer: BC Managed Care – PPO | Source: Ambulatory Visit | Attending: Pulmonary Disease | Admitting: Pulmonary Disease

## 2020-01-30 ENCOUNTER — Encounter (HOSPITAL_COMMUNITY): Payer: Self-pay | Admitting: Emergency Medicine

## 2020-01-30 DIAGNOSIS — R7989 Other specified abnormal findings of blood chemistry: Secondary | ICD-10-CM | POA: Diagnosis present

## 2020-01-30 DIAGNOSIS — J189 Pneumonia, unspecified organism: Secondary | ICD-10-CM | POA: Diagnosis not present

## 2020-01-30 DIAGNOSIS — Z79899 Other long term (current) drug therapy: Secondary | ICD-10-CM

## 2020-01-30 DIAGNOSIS — D6859 Other primary thrombophilia: Secondary | ICD-10-CM | POA: Diagnosis not present

## 2020-01-30 DIAGNOSIS — J9621 Acute and chronic respiratory failure with hypoxia: Secondary | ICD-10-CM | POA: Diagnosis present

## 2020-01-30 DIAGNOSIS — R739 Hyperglycemia, unspecified: Secondary | ICD-10-CM | POA: Diagnosis not present

## 2020-01-30 DIAGNOSIS — J069 Acute upper respiratory infection, unspecified: Secondary | ICD-10-CM | POA: Diagnosis not present

## 2020-01-30 DIAGNOSIS — R0602 Shortness of breath: Secondary | ICD-10-CM | POA: Diagnosis not present

## 2020-01-30 DIAGNOSIS — E871 Hypo-osmolality and hyponatremia: Secondary | ICD-10-CM | POA: Diagnosis not present

## 2020-01-30 DIAGNOSIS — R7401 Elevation of levels of liver transaminase levels: Secondary | ICD-10-CM | POA: Diagnosis present

## 2020-01-30 DIAGNOSIS — J1282 Pneumonia due to coronavirus disease 2019: Secondary | ICD-10-CM | POA: Diagnosis present

## 2020-01-30 DIAGNOSIS — U071 COVID-19: Secondary | ICD-10-CM

## 2020-01-30 DIAGNOSIS — R0600 Dyspnea, unspecified: Secondary | ICD-10-CM | POA: Diagnosis not present

## 2020-01-30 DIAGNOSIS — Z6831 Body mass index (BMI) 31.0-31.9, adult: Secondary | ICD-10-CM

## 2020-01-30 DIAGNOSIS — J9601 Acute respiratory failure with hypoxia: Secondary | ICD-10-CM | POA: Diagnosis not present

## 2020-01-30 DIAGNOSIS — R069 Unspecified abnormalities of breathing: Secondary | ICD-10-CM | POA: Diagnosis not present

## 2020-01-30 MED ORDER — SODIUM CHLORIDE 0.9 % IV SOLN: INTRAVENOUS | Status: DC | PRN

## 2020-01-30 MED ORDER — FAMOTIDINE IN NACL 20-0.9 MG/50ML-% IV SOLN: 20.0000 mg | Freq: Once | INTRAVENOUS | Status: DC | PRN

## 2020-01-30 MED ORDER — METHYLPREDNISOLONE SODIUM SUCC 125 MG IJ SOLR: 125.0000 mg | Freq: Once | INTRAMUSCULAR | Status: DC | PRN

## 2020-01-30 MED ORDER — DIPHENHYDRAMINE HCL 50 MG/ML IJ SOLN: 50.0000 mg | Freq: Once | INTRAMUSCULAR | Status: DC | PRN

## 2020-01-30 MED ORDER — SODIUM CHLORIDE 0.9 % IV SOLN: Freq: Once | INTRAVENOUS | Status: AC

## 2020-01-30 MED ORDER — EPINEPHRINE 0.3 MG/0.3ML IJ SOAJ: 0.3000 mg | Freq: Once | INTRAMUSCULAR | Status: DC | PRN

## 2020-01-30 MED ORDER — ALBUTEROL SULFATE HFA 108 (90 BASE) MCG/ACT IN AERS: 2.0000 | INHALATION_SPRAY | Freq: Once | RESPIRATORY_TRACT | Status: DC | PRN

## 2020-01-30 NOTE — Progress Notes (Signed)
Patient ID: Jesse Wilkins, male   DOB: 03/10/1971, 49 y.o.   MRN: [id number]    Diagnosis: [id number]  Physician:  Procedure: Covid Infusion Clinic Med: casirivimab\imdevimab infusion - Provided patient with casirivimab\imdevimab fact sheet for patients, parents and caregivers prior to infusion.  Diagnosis: [id number]  Complications: No immediate complications noted.  Discharge: Discharged home

## 2020-01-30 NOTE — Discharge Instructions (Signed)

## 2020-01-30 NOTE — ED Triage Notes (Signed)
Pt here by [facility], pt was diagnosed with COVID x 6 days ago, reports SOB x 4 days, O2 sats in triage 92% on RA

## 2020-01-31 ENCOUNTER — Encounter (HOSPITAL_COMMUNITY): Payer: Self-pay | Admitting: Internal Medicine

## 2020-01-31 ENCOUNTER — Inpatient Hospital Stay (HOSPITAL_COMMUNITY)
Admission: EM | Admit: 2020-01-31 | Discharge: 2020-02-02 | DRG: 177 | Disposition: A | Discharge status: Home / Self Care | Payer: BC Managed Care – PPO | Attending: Family Medicine | Admitting: Family Medicine

## 2020-01-31 ENCOUNTER — Emergency Department (HOSPITAL_COMMUNITY): Payer: BC Managed Care – PPO

## 2020-01-31 DIAGNOSIS — E871 Hypo-osmolality and hyponatremia: Secondary | ICD-10-CM | POA: Diagnosis present

## 2020-01-31 DIAGNOSIS — J1282 Pneumonia due to coronavirus disease 2019: Secondary | ICD-10-CM | POA: Diagnosis present

## 2020-01-31 DIAGNOSIS — R739 Hyperglycemia, unspecified: Secondary | ICD-10-CM | POA: Diagnosis present

## 2020-01-31 DIAGNOSIS — R0602 Shortness of breath: Secondary | ICD-10-CM | POA: Diagnosis present

## 2020-01-31 DIAGNOSIS — J9621 Acute and chronic respiratory failure with hypoxia: Secondary | ICD-10-CM | POA: Diagnosis present

## 2020-01-31 DIAGNOSIS — U071 COVID-19: Secondary | ICD-10-CM | POA: Diagnosis present

## 2020-01-31 DIAGNOSIS — J069 Acute upper respiratory infection, unspecified: Secondary | ICD-10-CM | POA: Diagnosis not present

## 2020-01-31 DIAGNOSIS — D6859 Other primary thrombophilia: Secondary | ICD-10-CM | POA: Diagnosis present

## 2020-01-31 DIAGNOSIS — J9601 Acute respiratory failure with hypoxia: Secondary | ICD-10-CM

## 2020-01-31 DIAGNOSIS — R7401 Elevation of levels of liver transaminase levels: Secondary | ICD-10-CM | POA: Diagnosis present

## 2020-01-31 DIAGNOSIS — R7989 Other specified abnormal findings of blood chemistry: Secondary | ICD-10-CM | POA: Diagnosis present

## 2020-01-31 DIAGNOSIS — Z79899 Other long term (current) drug therapy: Secondary | ICD-10-CM | POA: Diagnosis not present

## 2020-01-31 LAB — CBC WITH DIFFERENTIAL/PLATELET
Abs Immature Granulocytes: 0.04 10*3/uL (ref 0.00–0.07)
Basophils Absolute: 0 10*3/uL (ref 0.0–0.1)
Basophils Relative: 0 %
Eosinophils Absolute: 0 10*3/uL (ref 0.0–0.5)
Eosinophils Relative: 0 %
HCT: 41.8 % (ref 39.0–52.0)
Hemoglobin: 14 g/dL (ref 13.0–17.0)
Immature Granulocytes: 1 %
Lymphocytes Relative: 12 %
Lymphs Abs: 0.9 10*3/uL (ref 0.7–4.0)
MCH: 30.9 pg (ref 26.0–34.0)
MCHC: 33.5 g/dL (ref 30.0–36.0)
MCV: 92.3 fL (ref 80.0–100.0)
Monocytes Absolute: 0.2 10*3/uL (ref 0.1–1.0)
Monocytes Relative: 3 %
Neutro Abs: 6.3 10*3/uL (ref 1.7–7.7)
Neutrophils Relative %: 84 %
Platelets: 172 10*3/uL (ref 150–400)
RBC: 4.53 MIL/uL (ref 4.22–5.81)
RDW: 12.8 % (ref 11.5–15.5)
WBC: 7.4 10*3/uL (ref 4.0–10.5)
nRBC: 0 % (ref 0.0–0.2)

## 2020-01-31 LAB — COMPREHENSIVE METABOLIC PANEL
ALT: 86 U/L — ABNORMAL HIGH (ref 0–44)
AST: 42 U/L — ABNORMAL HIGH (ref 15–41)
Albumin: 3.7 g/dL (ref 3.5–5.0)
Alkaline Phosphatase: 35 U/L — ABNORMAL LOW (ref 38–126)
Anion gap: 7 (ref 5–15)
BUN: 28 mg/dL — ABNORMAL HIGH (ref 6–20)
CO2: 26 mmol/L (ref 22–32)
Calcium: 8 mg/dL — ABNORMAL LOW (ref 8.9–10.3)
Chloride: 100 mmol/L (ref 98–111)
Creatinine, Ser: 0.99 mg/dL (ref 0.61–1.24)
GFR, Estimated: >60 mL/min (ref >60)
Glucose, Bld: 145 mg/dL — ABNORMAL HIGH (ref 70–99)
Potassium: 4 mmol/L (ref 3.5–5.1)
Sodium: 133 mmol/L — ABNORMAL LOW (ref 135–145)
Total Bilirubin: 1 mg/dL (ref 0.3–1.2)
Total Protein: 7.2 g/dL (ref 6.5–8.1)

## 2020-01-31 LAB — CBG MONITORING, ED
Glucose-Capillary: 132 mg/dL — ABNORMAL HIGH (ref 70–99)
Glucose-Capillary: 169 mg/dL — ABNORMAL HIGH (ref 70–99)
Glucose-Capillary: 141 mg/dL — ABNORMAL HIGH (ref 70–99)

## 2020-01-31 MED ORDER — METHYLPREDNISOLONE SODIUM SUCC 40 MG IJ SOLR
40.0000 mg | Freq: Two times a day (BID) | INTRAMUSCULAR | Status: DC
  Administered 2020-01-31 – 2020-02-02 (×5): 40 mg via INTRAVENOUS
  Filled 2020-01-31 (×5): qty 1

## 2020-01-31 MED ORDER — ONDANSETRON HCL 4 MG PO TABS: 4.0000 mg | ORAL_TABLET | Freq: Four times a day (QID) | ORAL | Status: DC | PRN

## 2020-01-31 MED ORDER — POLYETHYLENE GLYCOL 3350 17 G PO PACK: 17.0000 g | PACK | Freq: Every day | ORAL | Status: DC | PRN

## 2020-01-31 MED ORDER — ACETAMINOPHEN 650 MG RE SUPP: 650.0000 mg | Freq: Four times a day (QID) | RECTAL | Status: DC | PRN

## 2020-01-31 MED ORDER — SODIUM CHLORIDE 0.9 % IV SOLN
100.0000 mg | INTRAVENOUS | Status: AC
  Administered 2020-01-31 (×2): 100 mg via INTRAVENOUS
  Filled 2020-01-31 (×2): qty 20

## 2020-01-31 MED ORDER — HYDROCOD POLST-CPM POLST ER 10-8 MG/5ML PO SUER: 5.0000 mL | Freq: Two times a day (BID) | ORAL | Status: DC | PRN

## 2020-01-31 MED ORDER — SODIUM CHLORIDE 0.9% FLUSH
3.0000 mL | Freq: Two times a day (BID) | INTRAVENOUS | Status: DC
  Administered 2020-01-31 – 2020-02-02 (×3): 3 mL via INTRAVENOUS

## 2020-01-31 MED ORDER — GUAIFENESIN-DM 100-10 MG/5ML PO SYRP: 10.0000 mL | ORAL_SOLUTION | ORAL | Status: DC | PRN

## 2020-01-31 MED ORDER — ACETAMINOPHEN 325 MG PO TABS: 650.0000 mg | ORAL_TABLET | Freq: Four times a day (QID) | ORAL | Status: DC | PRN

## 2020-01-31 MED ORDER — ZINC SULFATE 220 (50 ZN) MG PO CAPS
220.0000 mg | ORAL_CAPSULE | Freq: Every day | ORAL | Status: DC
  Administered 2020-01-31 – 2020-02-02 (×3): 220 mg via ORAL
  Filled 2020-01-31 (×3): qty 1

## 2020-01-31 MED ORDER — ZOLPIDEM TARTRATE 5 MG PO TABS
10.0000 mg | ORAL_TABLET | Freq: Every evening | ORAL | Status: DC | PRN
  Administered 2020-02-01: 10 mg via ORAL
  Filled 2020-01-31: qty 2

## 2020-01-31 MED ORDER — SODIUM CHLORIDE 0.9% FLUSH: 3.0000 mL | INTRAVENOUS | Status: DC | PRN

## 2020-01-31 MED ORDER — ASCORBIC ACID 500 MG PO TABS
500.0000 mg | ORAL_TABLET | Freq: Every day | ORAL | Status: DC
  Administered 2020-01-31 – 2020-02-02 (×3): 500 mg via ORAL
  Filled 2020-01-31 (×3): qty 1

## 2020-01-31 MED ORDER — SODIUM CHLORIDE 0.9 % IV SOLN: 250.0000 mL | INTRAVENOUS | Status: DC | PRN

## 2020-01-31 MED ORDER — SODIUM CHLORIDE 0.9 % IV SOLN
100.0000 mg | Freq: Every day | INTRAVENOUS | Status: DC
  Administered 2020-02-01 – 2020-02-02 (×2): 100 mg via INTRAVENOUS
  Filled 2020-01-31 (×2): qty 20

## 2020-01-31 MED ORDER — IPRATROPIUM-ALBUTEROL 20-100 MCG/ACT IN AERS
1.0000 | INHALATION_SPRAY | Freq: Four times a day (QID) | RESPIRATORY_TRACT | Status: DC
  Administered 2020-01-31 – 2020-02-02 (×8): 1 via RESPIRATORY_TRACT
  Filled 2020-01-31: qty 4

## 2020-01-31 MED ORDER — DEXAMETHASONE SODIUM PHOSPHATE 10 MG/ML IJ SOLN
6.0000 mg | Freq: Once | INTRAMUSCULAR | Status: AC
  Administered 2020-01-31: 6 mg via INTRAVENOUS
  Filled 2020-01-31: qty 1

## 2020-01-31 MED ORDER — BISACODYL 10 MG RE SUPP: 10.0000 mg | Freq: Every day | RECTAL | Status: DC | PRN

## 2020-01-31 MED ORDER — SODIUM CHLORIDE 0.9 % IV SOLN: 200.0000 mg | Freq: Once | INTRAVENOUS | Status: DC

## 2020-01-31 MED ORDER — ONDANSETRON HCL 4 MG/2ML IJ SOLN: 4.0000 mg | Freq: Four times a day (QID) | INTRAMUSCULAR | Status: DC | PRN

## 2020-01-31 MED ORDER — INSULIN ASPART 100 UNIT/ML ~~LOC~~ SOLN
0.0000 [IU] | Freq: Three times a day (TID) | SUBCUTANEOUS | Status: DC
  Administered 2020-01-31: 1 [IU] via SUBCUTANEOUS
  Administered 2020-01-31: 2 [IU] via SUBCUTANEOUS
  Administered 2020-01-31 – 2020-02-01 (×2): 1 [IU] via SUBCUTANEOUS
  Administered 2020-02-02: 2 [IU] via SUBCUTANEOUS
  Filled 2020-01-31 (×3): qty 1

## 2020-01-31 MED ORDER — ENOXAPARIN SODIUM 60 MG/0.6ML ~~LOC~~ SOLN
60.0000 mg | SUBCUTANEOUS | Status: DC
  Administered 2020-01-31 – 2020-02-02 (×3): 60 mg via SUBCUTANEOUS
  Filled 2020-01-31 (×3): qty 0.6

## 2020-01-31 MED ORDER — INSULIN ASPART 100 UNIT/ML ~~LOC~~ SOLN: 0.0000 [IU] | Freq: Every day | SUBCUTANEOUS | Status: DC

## 2020-01-31 MED ORDER — SODIUM CHLORIDE 0.9 % IV SOLN: 100.0000 mg | Freq: Every day | INTRAVENOUS | Status: DC

## 2020-01-31 MED ORDER — PREDNISONE 20 MG PO TABS: 50.0000 mg | ORAL_TABLET | Freq: Every day | ORAL | Status: DC

## 2020-01-31 NOTE — H&P (Signed)
Patient Demographics:    Jesse Wilkins, is a 49 y.o. male  MRN: [id number]   DOB - 1971-04-06  Admit Date - 01/31/2020  Outpatient Primary MD for the patient is Celene Squibb, MD   Assessment & Plan:    Principal Problem:   Acute respiratory disease due to COVID-19 virus Active Problems:   Acute on chronic respiratory failure with hypoxia ([place])   Pneumonia due to COVID-19 virus    1)Acute hypoxic respiratory failure secondary to COVID-19 infection/Pneumonia--- The treatment plan and use of medications  for treatment of COVID-19 infection and possible side effects were discussed with patient/family -Onset of symptoms 01/22/2020 -Diagnosed at [facility] in [place] on 01/24/2020 -Received monoclonal antibody on 01/30/2020 -Patient is not vaccinated against COVID-19 infection -Admitted on 01/31/2020 with Covid pneumonia and hypoxia -----Patient/Family verbalizes understanding and agrees to treatment protocols  --currently requiring oxygen not 5 L of nasal cannula --Patient is positive for COVID-19 infection, chest x-ray with findings of infiltrates/opacities,  patient is tachypneic/hypoxic and requiring continuous supplemental oxygen---patient meets criteria for initiation of Remdesivir AND Steroid therapy per protocol  --Check and trend inflammatory markers including D-dimer, ferritin and  CRP---also follow CBC and CMP --Supplemental oxygen to keep O2 sats above 93% -Follow serial chest x-rays and ABGs as indicated --- Encourage prone positioning for More than 16 hours/day in increments of 2 to 3 hours at a time if able to tolerate --Attempt to maintain euvolemic state --Zinc and vitamin C as ordered -Albuterol inhaler as needed -Accu-Cheks/fingersticks while on high-dose steroids -PPI while on high-dose  steroids -Enhanced dosage of anticoagulant for DVT prophylaxis given hypercoagulable state with COVID-19 infections -IV remdesivir and IV steroids started on 01/31/2020  2) social/ethics--patient is a full code  3) hyponatremia--sodium is 133 suspect dehydration related monitor closely  4)Transaminitis-- --Elevated LFTs noted with AST of 42 and ALT of 86  -T bili is 1.0 -Monitor closely with IV remdesivir use  5) hyperglycemia--glucose is 145, anticipate worsening hyperglycemia in the setting of steroid use -No prior diagnosis of diabetes mellitus Use Novolog/Humalog Sliding scale insulin with Accu-Cheks/Fingersticks as ordered   Disposition/Need for in-Hospital Stay- patient unable to be discharged at this time due to --acute hypoxic respiratory failure secondary to Covid 19 infection/pneumonia requiring IV steroids and IV remdesivir, as well as supplemental oxygen*  Status is: Inpatient  Remains inpatient appropriate because:acute hypoxic respiratory failure secondary to Covid 19 infection/pneumonia requiring IV steroids and IV remdesivir, as well as supplemental oxygen*   Dispo: The patient is from: Home              Anticipated d/c is to: Home              Anticipated d/c date is: 2 days              Patient currently is not medically stable to d/c. Barriers: Not Clinically Stable- acute hypoxic respiratory failure secondary to Covid 19 infection/pneumonia requiring IV  steroids and IV remdesivir, as well as supplemental oxygen*   With History of - Reviewed by me  Past Medical History:  Diagnosis Date  . Medical history non-contributory       Past Surgical History:  Procedure Laterality Date  . adnoid    . INCISIONAL HERNIA REPAIR N/A 04/13/2013   Procedure: HERNIA REPAIR INCISIONAL WITH MESH;  Surgeon: Jamesetta So, MD;  Location: [facility];  Service: General;  Laterality: N/A;  . INSERTION OF MESH N/A 07/07/2012   Procedure: INSERTION OF MESH;  Surgeon: Donato Heinz, MD;  Location: [facility];  Service: General;  Laterality: N/A;  . LAPAROSCOPIC APPENDECTOMY N/A 04/15/2017   Procedure: LAPAROSCOPIC APPENDECTOMY;  Surgeon: Virl Cagey, MD;  Location: [facility];  Service: General;  Laterality: N/A;  . UMBILICAL HERNIA REPAIR N/A 07/07/2012   Procedure: HERNIA REPAIR UMBILICAL ADULT;  Surgeon: Donato Heinz, MD;  Location: [facility];  Service: General;  Laterality: N/A;      Chief Complaint  Patient presents with  . Shortness of Breath      HPI:    Jesse Wilkins  is a 49 y.o. male without significant past medical history who presents to the ED with worsening cough, myalgias, fevers, dyspnea and is found to be hypoxic with O2 sats of 86% on room air at rest  --Onset of symptoms 01/22/2020 -Diagnosed at [facility] in [place] on 01/24/2020 -Received Monoclonal antibody on 01/30/2020 -Patient is not vaccinated against COVID-19 infection -Admitted on 01/31/2020 with Covid Pneumonia and hypoxia  -X-ray consistent with covid PNA -CBC WNL -Chemistry remarkable for sodium of 133 and a glucose of 145, creatinine 0.99 -Elevated LFTs noted with AST of 42 and ALT of 86  -T bili is 1.0  -Patient received IV remdesivir, steroids and supplemental oxygen hospitalist admission requested     Review of systems:    In addition to the HPI above,   A full Review of  Systems was done, all other systems reviewed are negative except as noted above in HPI , .    Social History:  Reviewed by me    Social History   Tobacco Use  . Smoking status: Never Smoker  . Smokeless tobacco: Never Used  Substance Use Topics  . Alcohol use: No    Comment: occasionaly       Family History :  Reviewed by me   History reviewed. No pertinent family history.    Home Medications:   Prior to Admission medications   Medication Sig Start Date End Date Taking? Authorizing Provider  albuterol (VENTOLIN HFA) 108 (90 Base) MCG/ACT inhaler Inhale 2 puffs into the  lungs every 4 (four) hours as needed for wheezing or shortness of breath.  01/28/20  Yes [provider]  azithromycin (ZITHROMAX) 250 MG tablet Take 250 mg by mouth as directed. 01/28/20  Yes [provider]  ibuprofen (ADVIL,MOTRIN) 200 MG tablet Take 400 mg by mouth every 6 (six) hours as needed for pain.    Yes [provider]  Naphazoline HCl (CLEAR EYES OP) Apply 1 drop to eye daily as needed (dry eyes).   Yes [provider]  ondansetron (ZOFRAN) 4 MG tablet Take 4 mg by mouth every 6 (six) hours as needed for nausea. 01/28/20  Yes [provider]  predniSONE (STERAPRED UNI-PAK 21 TAB) 10 MG (21) TBPK tablet Take by mouth as directed. 01/28/20  Yes [provider]  VITAMIN D, ERGOCALCIFEROL, PO Take 1 tablet by mouth daily.  Yes [provider]     Allergies:    No Known Allergies   Physical Exam:   Vitals  Blood pressure 111/63, pulse 72, temperature 99.6 F (37.6 C), temperature source Oral, resp. rate (!) 24, height 5\' 10"  (1.778 m), weight 83.9 kg, SpO2 91 %.  Physical Examination: General appearance - alert, ill  appearing, , some conversational dyspnea  mental status - alert, oriented to person, place, and time,  Eyes - sclera anicteric Neck - supple, no JVD elevation , Chest -diminished breath sounds with scattered rhonchi bilaterally Heart - S1 and S2 normal, regular  Abdomen - soft, nontender, nondistended, no masses or organomegaly Neurological - screening mental status exam normal, neck supple without rigidity, cranial nerves II through XII intact, DTR's normal and symmetric Extremities - no pedal edema noted, intact peripheral pulses  Skin - warm, dry     Data Review:    CBC Recent Labs  Lab 01/31/20 0330  WBC 7.4  HGB 14.0  HCT 41.8  PLT 172  MCV 92.3  MCH 30.9  MCHC 33.5  RDW 12.8  LYMPHSABS 0.9  MONOABS 0.2  EOSABS 0.0  BASOSABS 0.0    ------------------------------------------------------------------------------------------------------------------  Chemistries  Recent Labs  Lab 01/31/20 0330  NA 133*  K 4.0  CL 100  CO2 26  GLUCOSE 145*  BUN 28*  CREATININE 0.99  CALCIUM 8.0*  AST 42*  ALT 86*  ALKPHOS 35*  BILITOT 1.0   ------------------------------------------------------------------------------------------------------------------ estimated creatinine clearance is 93.2 mL/min (by C-G formula based on SCr of 0.99 mg/dL). ------------------------------------------------------------------------------------------------------------------ No results for input(s): TSH, T4TOTAL, T3FREE, THYROIDAB in the last 72 hours.  Invalid input(s): FREET3   Coagulation profile No results for input(s): INR, PROTIME in the last 168 hours. ------------------------------------------------------------------------------------------------------------------- No results for input(s): DDIMER in the last 72 hours. -------------------------------------------------------------------------------------------------------------------  Cardiac Enzymes No results for input(s): CKMB, TROPONINI, MYOGLOBIN in the last 168 hours.  Invalid input(s): CK ------------------------------------------------------------------------------------------------------------------ No results found for: BNP   ---------------------------------------------------------------------------------------------------------------  Urinalysis    Component Value Date/Time   COLORURINE YELLOW 04/15/2017 0809   APPEARANCEUR CLEAR 04/15/2017 0809   LABSPEC 1.010 04/15/2017 0809   PHURINE 7.0 04/15/2017 0809   GLUCOSEU NEGATIVE 04/15/2017 0809   HGBUR NEGATIVE 04/15/2017 0809   BILIRUBINUR NEGATIVE 04/15/2017 0809   KETONESUR NEGATIVE 04/15/2017 0809   PROTEINUR NEGATIVE 04/15/2017 0809   NITRITE NEGATIVE 04/15/2017 0809   LEUKOCYTESUR NEGATIVE 04/15/2017 0809     ----------------------------------------------------------------------------------------------------------------   Imaging Results:    DG Chest Port 1 View  Result Date: 01/31/2020 CLINICAL DATA:  Dyspnea EXAM: PORTABLE CHEST 1 VIEW COMPARISON:  None. FINDINGS: Lung volumes are small. There are bilateral mid and lower lung zone patchy pulmonary infiltrates, likely infectious or inflammatory in the acute setting. No pneumothorax or pleural effusion. Cardiac size within normal limits. No acute bone abnormality. IMPRESSION: Pulmonary hypoinflation. Superimposed bilateral infectious or inflammatory pulmonary infiltrate. Electronically Signed   By: Fidela Salisbury MD   On: 01/31/2020 02:12    Radiological Exams on Admission: DG Chest Port 1 View  Result Date: 01/31/2020 CLINICAL DATA:  Dyspnea EXAM: PORTABLE CHEST 1 VIEW COMPARISON:  None. FINDINGS: Lung volumes are small. There are bilateral mid and lower lung zone patchy pulmonary infiltrates, likely infectious or inflammatory in the acute setting. No pneumothorax or pleural effusion. Cardiac size within normal limits. No acute bone abnormality. IMPRESSION: Pulmonary hypoinflation. Superimposed bilateral infectious or inflammatory pulmonary infiltrate. Electronically Signed   By: Fidela Salisbury MD   On: 01/31/2020 02:12  DVT Prophylaxis -SCD  /lovenox AM Labs Ordered, also please review Full Orders  Family Communication: Admission, patients condition and plan of care including tests being ordered have been discussed with the patient who indicate understanding and agree with the plan   Code Status - Full Code  Likely DC to  home with clinical improvement from Covid pneumonia  Condition   stable  Roxan Hockey M.D on 01/31/2020 at 6:32 PM Go to [email or web address] -  for contact info  [facility] - Office  [phone]

## 2020-01-31 NOTE — ED Notes (Signed)
Pt O2 86% after ambulating from wheelchair to stretcher.

## 2020-01-31 NOTE — ED Provider Notes (Signed)
[facility] EMERGENCY DEPARTMENT Provider Note   CSN: [id number] Arrival date & time: 01/30/20  2045     History Chief Complaint  Patient presents with  . Shortness of Breath    Jesse Wilkins is a 49 y.o. male.  The history is provided by the patient.  Shortness of Breath Severity:  Moderate Onset quality:  Gradual Duration:  6 days Timing:  Intermittent Progression:  Worsening Chronicity:  New Relieved by:  Nothing Worsened by:  Exertion Associated symptoms: cough, fever and headaches   Associated symptoms: no hemoptysis   Patient diagnosed with COVID-19 last week.  He reports increasing cough, fevers and shortness of breath.  He reports myalgias and headaches.  He recently received the outpatient antibody infusion. Patient is a non-smoker Patient is unvaccinated    Past Medical History:  Diagnosis Date  . Medical history non-contributory     Patient Active Problem List   Diagnosis Date Noted  . Acute appendicitis, uncomplicated     Past Surgical History:  Procedure Laterality Date  . adnoid    . INCISIONAL HERNIA REPAIR N/A 04/13/2013   Procedure: HERNIA REPAIR INCISIONAL WITH MESH;  Surgeon: Jamesetta So, MD;  Location: [facility];  Service: General;  Laterality: N/A;  . INSERTION OF MESH N/A 07/07/2012   Procedure: INSERTION OF MESH;  Surgeon: Donato Heinz, MD;  Location: [facility];  Service: General;  Laterality: N/A;  . LAPAROSCOPIC APPENDECTOMY N/A 04/15/2017   Procedure: LAPAROSCOPIC APPENDECTOMY;  Surgeon: Virl Cagey, MD;  Location: [facility];  Service: General;  Laterality: N/A;  . UMBILICAL HERNIA REPAIR N/A 07/07/2012   Procedure: HERNIA REPAIR UMBILICAL ADULT;  Surgeon: Donato Heinz, MD;  Location: [facility];  Service: General;  Laterality: N/A;       History reviewed. No pertinent family history.  Social History   Tobacco Use  . Smoking status: Never Smoker  . Smokeless tobacco: Never Used  Substance Use Topics  . Alcohol use: No     Comment: occasionaly  . Drug use: No    Home Medications Prior to Admission medications   Medication Sig Start Date End Date Taking? Authorizing Provider  ibuprofen (ADVIL,MOTRIN) 200 MG tablet Take 400 mg by mouth every 6 (six) hours as needed for pain.     [provider]  mometasone (NASONEX) 50 MCG/ACT nasal spray Place 2 sprays into the nose daily as needed (nasal congestion).    [provider]  Naphazoline HCl (CLEAR EYES OP) Apply 1 drop to eye daily as needed (dry eyes).    [provider]  Oreg-Peppermint-Thyme-Gldnseal (YEAST FORMULA PO) Take 1 tablet by mouth daily.    [provider]  Pseudoephedrine HCl (SUDAFED PO) Take 1 tablet by mouth daily as needed (sinus).    [provider]  VITAMIN D, ERGOCALCIFEROL, PO Take 1 tablet by mouth daily.    [provider]  zolpidem (AMBIEN) 10 MG tablet Take 10 mg by mouth at bedtime as needed for sleep.    [provider]    Allergies    Patient has no known allergies.  Review of Systems   Review of Systems  Constitutional: Positive for fever.  Respiratory: Positive for cough and shortness of breath. Negative for hemoptysis.   Musculoskeletal: Positive for myalgias.  Neurological: Positive for headaches.  All other systems reviewed and are negative.   Physical Exam Updated Vital Signs BP 120/73 (BP Location: Left Arm)   Pulse 78   Temp 99.6 F (37.6  C) (Oral)   Resp (!) 22   Ht 1.778 m (5\' 10" )   Wt 83.9 kg   SpO2 94%   BMI 26.54 kg/m   Physical Exam CONSTITUTIONAL: Well developed/well nourished HEAD: Normocephalic/atraumatic EYES: EOMI/PERRL ENMT: Mucous membranes moist NECK: supple no meningeal signs SPINE/BACK:entire spine nontender CV: S1/S2 noted, no murmurs/rubs/gallops noted LUNGS: Crackles bilaterally, mild tachypnea.  Hypoxic on room air to 86% ABDOMEN: soft, nontender NEURO: Pt is awake/alert/appropriate, moves all extremitiesx4.  No facial  droop.   EXTREMITIES: pulses normal/equal, full ROM SKIN: warm, color normal PSYCH: no abnormalities of mood noted, alert and oriented to situation  ED Results / Procedures / Treatments   Labs (all labs ordered are listed, but only abnormal results are displayed) Labs Reviewed  COMPREHENSIVE METABOLIC PANEL - Abnormal; Notable for the following components:      Result Value   Sodium 133 (*)    Glucose, Bld 145 (*)    BUN 28 (*)    Calcium 8.0 (*)    AST 42 (*)    ALT 86 (*)    Alkaline Phosphatase 35 (*)    All other components within normal limits  CBC WITH DIFFERENTIAL/PLATELET    EKG EKG Interpretation  Date/Time:  Saturday January 30 2020 21:09:59 EDT Ventricular Rate:  87 PR Interval:  124 QRS Duration: 94 QT Interval:  348 QTC Calculation: 418 R Axis:   43 Text Interpretation: Normal sinus rhythm Normal ECG No previous ECGs available Interpretation limited secondary to artifact Confirmed by Ripley Fraise [phone]) on 01/31/2020 3:50:12 AM   Radiology DG Chest Port 1 View  Result Date: 01/31/2020 CLINICAL DATA:  Dyspnea EXAM: PORTABLE CHEST 1 VIEW COMPARISON:  None. FINDINGS: Lung volumes are small. There are bilateral mid and lower lung zone patchy pulmonary infiltrates, likely infectious or inflammatory in the acute setting. No pneumothorax or pleural effusion. Cardiac size within normal limits. No acute bone abnormality. IMPRESSION: Pulmonary hypoinflation. Superimposed bilateral infectious or inflammatory pulmonary infiltrate. Electronically Signed   By: Fidela Salisbury MD   On: 01/31/2020 02:12    Procedures .Critical Care Performed by: Ripley Fraise, MD Authorized by: Ripley Fraise, MD   Critical care provider statement:    Critical care time (minutes):  38   Critical care start time:  01/31/2020 4:02 AM   Critical care end time:  01/31/2020 4:40 AM   Critical care time was exclusive of:  Separately billable procedures and treating other patients    Critical care was necessary to treat or prevent imminent or life-threatening deterioration of the following conditions:  Respiratory failure and sepsis   Critical care was time spent personally by me on the following activities:  Ordering and review of radiographic studies, ordering and review of laboratory studies, ordering and performing treatments and interventions, pulse oximetry, re-evaluation of patient's condition, review of old charts, examination of patient, evaluation of patient's response to treatment and development of treatment plan with patient or surrogate   I assumed direction of critical care for this patient from another provider in my specialty: no       Medications Ordered in ED Medications  remdesivir 100 mg in sodium chloride 0.9 % 100 mL IVPB (100 mg Intravenous New Bag/Given 01/31/20 0422)    Followed by  remdesivir 100 mg in sodium chloride 0.9 % 100 mL IVPB (has no administration in time range)  dexamethasone (DECADRON) injection 6 mg (6 mg Intravenous Given 01/31/20 0419)    ED Course  I have reviewed the triage vital  signs and the nursing notes.  Pertinent labs & imaging results that were available during my care of the patient were reviewed by me and considered in my medical decision making (see chart for details).    MDM Rules/Calculators/A&P                          4:02 AM Patient diagnosed with COVID-19 recently and and did receive the antibody infusion, but now having increasing cough and shortness of breath.  Patient is hypoxic on room air to 86%.  He is currently on oxygen.  He is tachypneic with crackles bilaterally.  Patient will be admitted to the hospital.  Decadron and remdesivir have been ordered 4:53 AM Discussed with Dr. Josephine Cables for admission.  Patient has been given Decadron and remdesivir Patient currently on 2 L nasal cannula Jesse Wilkins was evaluated in Emergency Department on 01/31/2020 for the symptoms described in the history of  present illness. He was evaluated in the context of the global COVID-19 pandemic, which necessitated consideration that the patient might be at risk for infection with the SARS-CoV-2 virus that causes COVID-19. Institutional protocols and algorithms that pertain to the evaluation of patients at risk for COVID-19 are in a state of rapid change based on information released by regulatory bodies including the CDC and federal and state organizations. These policies and algorithms were followed during the patient's care in the ED.  Final Clinical Impression(s) / ED Diagnoses Final diagnoses:  Pneumonia due to COVID-19 virus  Acute respiratory failure with hypoxia [facility])    Rx / DC Orders ED Discharge Orders    None       Ripley Fraise, MD 01/31/20 [phone]

## 2020-01-31 NOTE — ED Notes (Signed)
Call from spouse   Honest Vanleer   [phone]  She is given update but request a call from provider for update

## 2020-01-31 NOTE — Progress Notes (Signed)
Patient wanted to eat breakfast. Treatment postponed .

## 2020-01-31 NOTE — ED Notes (Signed)
Pt reports he has had no covid vaccines  Here for pos covid and dyspnea   NAD   meds delivered   Awaiting room assignment

## 2020-01-31 NOTE — ED Notes (Signed)
Linens refreshed with pt asking for a softer pillow  Pillow exchanged

## 2020-01-31 NOTE — ED Notes (Signed)
Meal provided 

## 2020-01-31 NOTE — ED Notes (Signed)
Insulin is being given late due to meals delivered very late to this dept for boarding pt

## 2020-01-31 NOTE — ED Notes (Signed)
ED Provider at bedside. 

## 2020-02-01 DIAGNOSIS — J069 Acute upper respiratory infection, unspecified: Secondary | ICD-10-CM | POA: Diagnosis not present

## 2020-02-01 DIAGNOSIS — U071 COVID-19: Secondary | ICD-10-CM | POA: Diagnosis not present

## 2020-02-01 LAB — CBC WITH DIFFERENTIAL/PLATELET
Abs Immature Granulocytes: 0.03 10*3/uL (ref 0.00–0.07)
Basophils Absolute: 0 10*3/uL (ref 0.0–0.1)
Basophils Relative: 0 %
Eosinophils Absolute: 0 10*3/uL (ref 0.0–0.5)
Eosinophils Relative: 0 %
HCT: 41 % (ref 39.0–52.0)
Hemoglobin: 13.8 g/dL (ref 13.0–17.0)
Immature Granulocytes: 1 %
Lymphocytes Relative: 14 %
Lymphs Abs: 0.9 10*3/uL (ref 0.7–4.0)
MCH: 30.6 pg (ref 26.0–34.0)
MCHC: 33.7 g/dL (ref 30.0–36.0)
MCV: 90.9 fL (ref 80.0–100.0)
Monocytes Absolute: 0.3 10*3/uL (ref 0.1–1.0)
Monocytes Relative: 5 %
Neutro Abs: 5.2 10*3/uL (ref 1.7–7.7)
Neutrophils Relative %: 80 %
Platelets: 206 10*3/uL (ref 150–400)
RBC: 4.51 MIL/uL (ref 4.22–5.81)
RDW: 12.2 % (ref 11.5–15.5)
WBC: 6.5 10*3/uL (ref 4.0–10.5)
nRBC: 0 % (ref 0.0–0.2)

## 2020-02-01 LAB — MAGNESIUM: Magnesium: 2.2 mg/dL (ref 1.7–2.4)

## 2020-02-01 LAB — COMPREHENSIVE METABOLIC PANEL
ALT: 33 U/L (ref 0–44)
AST: 31 U/L (ref 15–41)
Albumin: 3.2 g/dL — ABNORMAL LOW (ref 3.5–5.0)
Alkaline Phosphatase: 30 U/L — ABNORMAL LOW (ref 38–126)
Anion gap: 9 (ref 5–15)
BUN: 27 mg/dL — ABNORMAL HIGH (ref 6–20)
CO2: 26 mmol/L (ref 22–32)
Calcium: 8.4 mg/dL — ABNORMAL LOW (ref 8.9–10.3)
Chloride: 102 mmol/L (ref 98–111)
Creatinine, Ser: 0.8 mg/dL (ref 0.61–1.24)
GFR, Estimated: >60 mL/min (ref >60)
Glucose, Bld: 160 mg/dL — ABNORMAL HIGH (ref 70–99)
Potassium: 3.7 mmol/L (ref 3.5–5.1)
Sodium: 137 mmol/L (ref 135–145)
Total Bilirubin: 0.8 mg/dL (ref 0.3–1.2)
Total Protein: 6.4 g/dL — ABNORMAL LOW (ref 6.5–8.1)

## 2020-02-01 LAB — CBG MONITORING, ED
Glucose-Capillary: 179 mg/dL — ABNORMAL HIGH (ref 70–99)
Glucose-Capillary: 119 mg/dL — ABNORMAL HIGH (ref 70–99)

## 2020-02-01 LAB — GLUCOSE, CAPILLARY
Glucose-Capillary: 167 mg/dL — ABNORMAL HIGH (ref 70–99)
Glucose-Capillary: 141 mg/dL — ABNORMAL HIGH (ref 70–99)
Glucose-Capillary: 180 mg/dL — ABNORMAL HIGH (ref 70–99)

## 2020-02-01 LAB — C-REACTIVE PROTEIN: CRP: 6 mg/dL — ABNORMAL HIGH (ref <1.0)

## 2020-02-01 LAB — PHOSPHORUS: Phosphorus: 2.3 mg/dL — ABNORMAL LOW (ref 2.5–4.6)

## 2020-02-01 LAB — FERRITIN: Ferritin: 651 ng/mL — ABNORMAL HIGH (ref 24–336)

## 2020-02-01 LAB — HIV ANTIBODY (ROUTINE TESTING W REFLEX): HIV Screen 4th Generation wRfx: NONREACTIVE

## 2020-02-01 LAB — D-DIMER, QUANTITATIVE: D-Dimer, Quant: 0.48 ug/mL-FEU (ref 0.00–0.50)

## 2020-02-01 NOTE — Plan of Care (Signed)

## 2020-02-01 NOTE — Plan of Care (Signed)
  Problem: Education: Goal: Knowledge of General Education information will improve Description: Including pain rating scale, medication(s)/side effects and non-pharmacologic comfort measures Outcome: Progressing   Problem: Health Behavior/Discharge Planning: Goal: Ability to manage health-related needs will improve Outcome: Progressing   Problem: Clinical Measurements: Goal: Will remain free from infection Outcome: Progressing Goal: Diagnostic test results will improve Outcome: Progressing Goal: Respiratory complications will improve Outcome: Progressing   Problem: Education: Goal: Knowledge of risk factors and measures for prevention of condition will improve Outcome: Progressing   Problem: Coping: Goal: Psychosocial and spiritual needs will be supported Outcome: Progressing   Problem: Respiratory: Goal: Will maintain a patent airway Outcome: Progressing

## 2020-02-01 NOTE — Progress Notes (Signed)
Patient Demographics:    Jesse Wilkins, is a 49 y.o. male, DOB - July 28, 1970, BPP:[id number]  Admit date - 01/31/2020   Admitting Physician Bernadette Hoit, DO  Outpatient Primary MD for the patient is Celene Squibb, MD  LOS - 1   Chief Complaint  Patient presents with  . Shortness of Breath        Subjective:    Jesse Wilkins today has no further fevers, no emesis,  No chest pain,    ---cough and dyspnea persist----currently on 4 Liters of oxygen via [place]   Assessment  & Plan :    Principal Problem:   Acute respiratory disease due to COVID-19 virus Active Problems:   Acute on chronic respiratory failure with hypoxia (HCC)   Pneumonia due to COVID-19 virus  Brief Summary:- 49 y.o. male without significant past medical history admitted on 01/31/20-with acute hypoxic respiratory failure secondary to Covid respiratory infection and Covid pneumonia   A/p 1)Acute hypoxic respiratory failure secondary to COVID-19 infection/Pneumonia--- The treatment plan and use of medications for treatment of COVID-19 infectionand possible side effects were discussed with patient -Onset of symptoms 01/22/2020 -Diagnosed at [facility] in [place] on 01/24/2020 -Received monoclonal antibody on 01/30/2020 -Patient is not vaccinated against COVID-19 infection -Admitted on 01/31/2020 with Covid pneumonia and hypoxia -----Patient/Family verbalizes understanding and agrees to treatment protocols  --currently requiring oxygen 4 L of nasal cannula --Patient is positive for COVID-19 infection, chest x-ray with findings of infiltrates/opacities,  patient is tachypneic/hypoxic and requiring continuous supplemental oxygen---patient meets criteria for initiation of Remdesivir AND Steroid therapy per protocol  --Check and trend inflammatory markers including D-dimer, ferritin and  CRP---also follow CBC and CMP --Supplemental  oxygen to keep O2 sats above 93% -Follow serial chest x-rays and ABGs as indicated --- Encourage prone positioning for More than 16 hours/day in increments of 2 to 3 hours at a time if able to tolerate --Attempt to maintain euvolemic state --Zinc and vitamin C as ordered -Albuterol inhaler as needed -Accu-Cheks/fingersticks while on high-dose steroids -PPI while on high-dose steroids -Enhanced dosage of anticoagulant for DVT prophylaxis given hypercoagulable state with COVID-19 infections -IV remdesivir and IV steroids started on 01/31/2020 COVID-19 Labs  Recent Labs    02/01/20 1117  DDIMER 0.48  FERRITIN 651*  CRP 6.0*   2) social/ethics--patient is a full code  3) hyponatremia--resolved, sodium is up to 137 from 133 on admission  4)Transaminitis-- --Elevated LFTs noted, overall improving AST of 42 >> 31  ALT of 86 >>33 -T bili is 1.0 >> 0.8 -Monitor closely with IV remdesivir use  5) hyperglycemia-- anticipate worsening hyperglycemia in the setting of steroid use -No prior diagnosis of diabetes mellitus Use Novolog/Humalog Sliding scale insulin with Accu-Cheks/Fingersticks as ordered   Disposition/Need for in-Hospital Stay- patient unable to be discharged at this time due to --acute hypoxic respiratory failure secondary to Covid 19 infection/pneumonia requiring IV steroids and IV remdesivir, as well as supplemental oxygen*  Status is: Inpatient  Remains inpatient appropriate because:acute hypoxic respiratory failure secondary to Covid 19 infection/pneumonia requiring IV steroids and IV remdesivir, as well as supplemental oxygen*   Dispo: The patient is from: Home  Anticipated d/c is to: Home  Anticipated d/c date is: 2 days  Patient currently is  not medically stable to d/c. Barriers: Not Clinically Stable- acute hypoxic respiratory failure secondary to Covid 19 infection/pneumonia requiring IV steroids and IV remdesivir, as  well as supplemental oxygen*  Code Status : Full code  Family Communication:    (patient is alert, awake and coherent)  Previously Discussed with pt's wife  Consults  :  na  DVT Prophylaxis  :  Lovenox -  - SCDs   Lab Results  Component Value Date   PLT 206 02/01/2020    Inpatient Medications  Scheduled Meds: . vitamin C  500 mg Oral Daily  . enoxaparin (LOVENOX) injection  60 mg Subcutaneous Q24H  . insulin aspart  0-5 Units Subcutaneous QHS  . insulin aspart  0-9 Units Subcutaneous TID WC  . Ipratropium-Albuterol  1 puff Inhalation Q6H  . methylPREDNISolone (SOLU-MEDROL) injection  40 mg Intravenous Q12H   Followed by  . [START ON 02/03/2020] predniSONE  50 mg Oral Daily  . sodium chloride flush  3 mL Intravenous Q12H  . sodium chloride flush  3 mL Intravenous Q12H  . zinc sulfate  220 mg Oral Daily   Continuous Infusions: . sodium chloride    . remdesivir 100 mg in NS 100 mL 100 mg (02/01/20 1017)   PRN Meds:.sodium chloride, acetaminophen **OR** acetaminophen, bisacodyl, chlorpheniramine-HYDROcodone, guaiFENesin-dextromethorphan, ondansetron **OR** ondansetron (ZOFRAN) IV, polyethylene glycol, sodium chloride flush, zolpidem    Anti-infectives (From admission, onward)   Start     Dose/Rate Route Frequency Ordered Stop   02/01/20 1000  remdesivir 100 mg in sodium chloride 0.9 % 100 mL IVPB       "Followed by" Linked Group Details   100 mg 200 mL/hr over 30 Minutes Intravenous Daily 01/31/20 0357 02/05/20 0959   02/01/20 1000  remdesivir 100 mg in sodium chloride 0.9 % 100 mL IVPB  Status:  Discontinued       "Followed by" Linked Group Details   100 mg 200 mL/hr over 30 Minutes Intravenous Daily 01/31/20 0715 01/31/20 0739   01/31/20 0715  remdesivir 200 mg in sodium chloride 0.9% 250 mL IVPB  Status:  Discontinued       "Followed by" Linked Group Details   200 mg 580 mL/hr over 30 Minutes Intravenous Once 01/31/20 0715 01/31/20 0741   01/31/20 0400  remdesivir  100 mg in sodium chloride 0.9 % 100 mL IVPB       "Followed by" Linked Group Details   100 mg 200 mL/hr over 30 Minutes Intravenous Every 30 min 01/31/20 0357 01/31/20 0538        Objective:   Vitals:   02/01/20 0900 02/01/20 0958 02/01/20 1017 02/01/20 1316  BP:  110/74 118/76   Pulse:  63 68   Resp:  20 20   Temp:      TempSrc:      SpO2: 91% 90% 92% 93%  Weight:      Height:        Wt Readings from Last 3 Encounters:  01/30/20 83.9 kg  05/07/17 95.3 kg  04/15/17 90.7 kg     Intake/Output Summary (Last 24 hours) at 02/01/2020 1320 Last data filed at 02/01/2020 0700 Gross per 24 hour  Intake --  Output 900 ml  Net -900 ml    Physical Exam  Gen:- Awake Alert,  Conversational dyspnea HEENT:- [place].AT, No sclera icterus Nose- [place] 4 L/min Neck-Supple Neck,No JVD,.  Lungs-diminished breath sounds with scattered rhonchi  CV- S1, S2 normal, regular  Abd-  +ve B.Sounds, Abd Soft, No  tenderness,    Extremity/Skin:- No  edema, pedal pulses present  Psych-affect is appropriate, oriented x3 Neuro-generalized weakness and fatigue, no new focal deficits, no tremors   Data Review:   Micro Results No results found for this or any previous visit (from the past 240 hour(s)).  Radiology Reports DG Chest Port 1 View  Result Date: 01/31/2020 CLINICAL DATA:  Dyspnea EXAM: PORTABLE CHEST 1 VIEW COMPARISON:  None. FINDINGS: Lung volumes are small. There are bilateral mid and lower lung zone patchy pulmonary infiltrates, likely infectious or inflammatory in the acute setting. No pneumothorax or pleural effusion. Cardiac size within normal limits. No acute bone abnormality. IMPRESSION: Pulmonary hypoinflation. Superimposed bilateral infectious or inflammatory pulmonary infiltrate. Electronically Signed   By: Fidela Salisbury MD   On: 01/31/2020 02:12     CBC Recent Labs  Lab 01/31/20 0330 02/01/20 1117  WBC 7.4 6.5  HGB 14.0 13.8  HCT 41.8 41.0  PLT 172 206  MCV 92.3 90.9  MCH  30.9 30.6  MCHC 33.5 33.7  RDW 12.8 12.2  LYMPHSABS 0.9 0.9  MONOABS 0.2 0.3  EOSABS 0.0 0.0  BASOSABS 0.0 0.0    Chemistries  Recent Labs  Lab 01/31/20 0330 02/01/20 1117  NA 133* 137  K 4.0 3.7  CL 100 102  CO2 26 26  GLUCOSE 145* 160*  BUN 28* 27*  CREATININE 0.99 0.80  CALCIUM 8.0* 8.4*  MG  --  2.2  AST 42* 31  ALT 86* 33  ALKPHOS 35* 30*  BILITOT 1.0 0.8   ------------------------------------------------------------------------------------------------------------------ No results for input(s): CHOL, HDL, LDLCALC, TRIG, CHOLHDL, LDLDIRECT in the last 72 hours.  No results found for: HGBA1C ------------------------------------------------------------------------------------------------------------------ No results for input(s): TSH, T4TOTAL, T3FREE, THYROIDAB in the last 72 hours.  Invalid input(s): FREET3 ------------------------------------------------------------------------------------------------------------------ Recent Labs    02/01/20 1117  FERRITIN 651*    Coagulation profile No results for input(s): INR, PROTIME in the last 168 hours.  Recent Labs    02/01/20 1117  DDIMER 0.48    Cardiac Enzymes No results for input(s): CKMB, TROPONINI, MYOGLOBIN in the last 168 hours.  Invalid input(s): CK ------------------------------------------------------------------------------------------------------------------ No results found for: BNP   Roxan Hockey M.D on 02/01/2020 at 1:20 PM  Go to [email or web address] - for contact info  [facility] - Office  [phone]

## 2020-02-02 DIAGNOSIS — U071 COVID-19: Secondary | ICD-10-CM | POA: Diagnosis not present

## 2020-02-02 DIAGNOSIS — J069 Acute upper respiratory infection, unspecified: Secondary | ICD-10-CM | POA: Diagnosis not present

## 2020-02-02 LAB — CBC WITH DIFFERENTIAL/PLATELET
Abs Immature Granulocytes: 0.04 10*3/uL (ref 0.00–0.07)
Basophils Absolute: 0 10*3/uL (ref 0.0–0.1)
Basophils Relative: 0 %
Eosinophils Absolute: 0 10*3/uL (ref 0.0–0.5)
Eosinophils Relative: 0 %
HCT: 41 % (ref 39.0–52.0)
Hemoglobin: 14 g/dL (ref 13.0–17.0)
Immature Granulocytes: 1 %
Lymphocytes Relative: 14 %
Lymphs Abs: 1.1 10*3/uL (ref 0.7–4.0)
MCH: 30.8 pg (ref 26.0–34.0)
MCHC: 34.1 g/dL (ref 30.0–36.0)
MCV: 90.3 fL (ref 80.0–100.0)
Monocytes Absolute: 0.5 10*3/uL (ref 0.1–1.0)
Monocytes Relative: 6 %
Neutro Abs: 6.4 10*3/uL (ref 1.7–7.7)
Neutrophils Relative %: 79 %
Platelets: 223 10*3/uL (ref 150–400)
RBC: 4.54 MIL/uL (ref 4.22–5.81)
RDW: 12 % (ref 11.5–15.5)
WBC: 8.1 10*3/uL (ref 4.0–10.5)
nRBC: 0 % (ref 0.0–0.2)

## 2020-02-02 LAB — COMPREHENSIVE METABOLIC PANEL
ALT: 32 U/L (ref 0–44)
AST: 26 U/L (ref 15–41)
Albumin: 3.1 g/dL — ABNORMAL LOW (ref 3.5–5.0)
Alkaline Phosphatase: 30 U/L — ABNORMAL LOW (ref 38–126)
Anion gap: 9 (ref 5–15)
BUN: 29 mg/dL — ABNORMAL HIGH (ref 6–20)
CO2: 26 mmol/L (ref 22–32)
Calcium: 8.5 mg/dL — ABNORMAL LOW (ref 8.9–10.3)
Chloride: 102 mmol/L (ref 98–111)
Creatinine, Ser: 0.78 mg/dL (ref 0.61–1.24)
GFR, Estimated: >60 mL/min (ref >60)
Glucose, Bld: 118 mg/dL — ABNORMAL HIGH (ref 70–99)
Potassium: 3.9 mmol/L (ref 3.5–5.1)
Sodium: 137 mmol/L (ref 135–145)
Total Bilirubin: 1.1 mg/dL (ref 0.3–1.2)
Total Protein: 6 g/dL — ABNORMAL LOW (ref 6.5–8.1)

## 2020-02-02 LAB — GLUCOSE, CAPILLARY
Glucose-Capillary: 109 mg/dL — ABNORMAL HIGH (ref 70–99)
Glucose-Capillary: 158 mg/dL — ABNORMAL HIGH (ref 70–99)

## 2020-02-02 LAB — MAGNESIUM: Magnesium: 2.1 mg/dL (ref 1.7–2.4)

## 2020-02-02 LAB — C-REACTIVE PROTEIN: CRP: 2.6 mg/dL — ABNORMAL HIGH (ref <1.0)

## 2020-02-02 LAB — FERRITIN: Ferritin: 429 ng/mL — ABNORMAL HIGH (ref 24–336)

## 2020-02-02 LAB — PHOSPHORUS: Phosphorus: 3.3 mg/dL (ref 2.5–4.6)

## 2020-02-02 MED ORDER — HYDROCOD POLST-CPM POLST ER 10-8 MG/5ML PO SUER
5.0000 mL | Freq: Two times a day (BID) | ORAL | 0 refills | Status: AC | PRN
Start: 2020-02-02 — End: ?

## 2020-02-02 MED ORDER — ASCORBIC ACID 500 MG PO TABS: 500.0000 mg | ORAL_TABLET | Freq: Every day | ORAL | 0 refills | Status: AC

## 2020-02-02 MED ORDER — GUAIFENESIN-DM 100-10 MG/5ML PO SYRP: 10.0000 mL | ORAL_SOLUTION | ORAL | 0 refills | Status: AC | PRN

## 2020-02-02 MED ORDER — ALBUTEROL SULFATE HFA 108 (90 BASE) MCG/ACT IN AERS: 2.0000 | INHALATION_SPRAY | RESPIRATORY_TRACT | 0 refills | Status: AC | PRN

## 2020-02-02 MED ORDER — PREDNISONE 50 MG PO TABS: 50.0000 mg | ORAL_TABLET | Freq: Every day | ORAL | 0 refills | Status: AC

## 2020-02-02 MED ORDER — ZINC SULFATE 220 (50 ZN) MG PO CAPS: 220.0000 mg | ORAL_CAPSULE | Freq: Every day | ORAL | 1 refills | Status: AC

## 2020-02-02 MED ORDER — ACETAMINOPHEN 325 MG PO TABS
650.0000 mg | ORAL_TABLET | Freq: Four times a day (QID) | ORAL | 0 refills | Status: DC | PRN
Start: 2020-02-02 — End: 2020-04-12

## 2020-02-02 MED ORDER — ENOXAPARIN SODIUM 40 MG/0.4ML ~~LOC~~ SOLN: 40.0000 mg | SUBCUTANEOUS | Status: DC

## 2020-02-02 NOTE — TOC Transition Note (Signed)
Transition of Care [facility]) - CM/SW Discharge Note   Patient Details  Name: Jesse Wilkins MRN: [id number] Date of Birth: 1970-09-03  [facility] CM/SW Contact:  Ihor Gully, LCSW Phone Number: 02/02/2020, 2:13 PM   Clinical Narrative:    Patient admitted COVID+. In need of home oxygen. DME providers discussed. Patient chose [place], referral made.    Final next level of care: Home/Self Care Barriers to Discharge: No Barriers Identified   Patient Goals and CMS Choice        Discharge Placement                       Discharge Plan and Services                DME Arranged: Oxygen DME Agency: [facility] Date DME Agency Contacted: 02/02/20 Time DME Agency Contacted: [phone] Representative spoke with at DME Agency: staff at [place] Interventions     Readmission Risk Interventions No flowsheet data found.

## 2020-02-02 NOTE — Discharge Summary (Signed)
Jesse Wilkins, is a 49 y.o. male  DOB [birth year]  MRN [id number].  Admission date:  01/31/2020  Admitting Physician  Bernadette Hoit, DO  Discharge Date:  02/02/2020   Primary MD  Celene Squibb, MD  Recommendations for primary care physician for things to follow:   Patient scheduled for outpatient Remdesivir infusions at 11am on Wednesday 10/13 and Thursday 10/14 at [facility]. Please inform the patient to park at [place], as staff will be escorting the patient through the [place] entrance of the hospital. Appointments take approximately 45 minutes.    There is a wave flag banner located near the entrance on [address]. Turn into this entrance and immediately turn left or right and park in 1 of the 10 designated Covid Infusion Parking spots. There is a phone number on the sign, please call and let the staff know what spot you are in and we will come out and get you. For questions call [phone].  Thanks.  -- 1)You need oxygen at home at 4 L via nasal cannula continuously while awake and while asleep--- smoking or having open fires around oxygen can cause fire, significant injury and death  2)Patient scheduled for outpatient Remdesivir infusions at 11am on Wednesday 02/03/20 and Thursday 02/04/20 at [facility]. Please inform the patient to park at [place], as staff will be escorting the patient through the [place] entrance of the hospital. Appointments take approximately 45 minutes.    There is a wave flag banner located near the entrance on [address]. Turn into this entrance and immediately turn left or right and park in 1 of the 10 designated Covid Infusion Parking spots. There is a phone number on the sign, please call and let the staff know what spot you are in and we will come out and get you. For questions call [phone].  Thanks.  3)Avoid  ibuprofen/Advil/Aleve/Motrin/Goody Powders/Naproxen/BC powders/Meloxicam/Diclofenac/Indomethacin and other Nonsteroidal anti-inflammatory medications as these will make you more likely to bleed and can cause stomach ulcers, can also cause Kidney problems.   4)You are strongly advised to  to isolate/quarantine for at least 21 days from the date of your diagnosis with COVID-19 infection--please always wear a mask if you have to go outside the house  5)Follow-up with your primary care physician for video/virtual visit in about 10 to 14 days for reevaluation   Admission Diagnosis  Acute respiratory failure with hypoxia ([place]) [J96.01] COVID-19 virus infection [U07.1] Pneumonia due to COVID-19 virus [U07.1, J12.82]   Discharge Diagnosis  Acute respiratory failure with hypoxia ([place]) [J96.01] COVID-19 virus infection [U07.1] Pneumonia due to COVID-19 virus [U07.1, J12.82]   Principal Problem:   Acute respiratory disease due to COVID-19 virus Active Problems:   COVID-19 virus infection   Acute respiratory failure with hypoxia Due to Covid Resp Infection   Pneumonia due to COVID-19 virus      Past Medical History:  Diagnosis Date  . Medical history non-contributory  Past Surgical History:  Procedure Laterality Date  . adnoid    . INCISIONAL HERNIA REPAIR N/A 04/13/2013   Procedure: HERNIA REPAIR INCISIONAL WITH MESH;  Surgeon: Jamesetta So, MD;  Location: [facility];  Service: General;  Laterality: N/A;  . INSERTION OF MESH N/A 07/07/2012   Procedure: INSERTION OF MESH;  Surgeon: Donato Heinz, MD;  Location: [facility];  Service: General;  Laterality: N/A;  . LAPAROSCOPIC APPENDECTOMY N/A 04/15/2017   Procedure: LAPAROSCOPIC APPENDECTOMY;  Surgeon: Virl Cagey, MD;  Location: [facility];  Service: General;  Laterality: N/A;  . UMBILICAL HERNIA REPAIR N/A 07/07/2012   Procedure: HERNIA REPAIR UMBILICAL ADULT;  Surgeon: Donato Heinz, MD;  Location: [facility];  Service: General;   Laterality: N/A;     HPI  from the history and physical done on the day of admission:   Jesse Wilkins  is a 49 y.o. male without significant past medical history who presents to the ED with worsening cough, myalgias, fevers, dyspnea and is found to be hypoxic with O2 sats of 86% on room air at rest  --Onset of symptoms 01/22/2020 -Diagnosed at [facility] in [place] on 01/24/2020 -Received Monoclonal antibody on 01/30/2020 -Patient is not vaccinated against COVID-19 infection -Admitted on 01/31/2020 with Covid Pneumonia and hypoxia  -X-ray consistent with covid PNA -CBC WNL -Chemistry remarkable for sodium of 133 and a glucose of 145, creatinine 0.99 -Elevated LFTs noted with AST of 42 and ALT of 86  -T bili is 1.0  -Patient received IV remdesivir, steroids and supplemental oxygen hospitalist admission requested      Hospital Course:        1)Acute hypoxic respiratory failure secondary to COVID-19 infection/Pneumonia--- The treatment plan and use of medications for treatment of COVID-19 infectionand possible side effects were discussed with patient/family -Onset of symptoms 01/22/2020 -Diagnosed at [facility] in [place] on 01/24/2020 -Received monoclonal antibody on 01/30/2020 -Patient is not vaccinated against COVID-19 infection -Admitted on 01/31/2020 with Covid pneumonia and hypoxia hypoxic with O2 sats of 85% on room air at rest -----Patient/Family verbalizes understanding and agrees to treatment protocols --currently requiring oxygen 4 L of nasal cannula --Patient is positive for COVID-19 infection, chest x-ray with findings of infiltrates/opacities,  patient is tachypneic/hypoxic and requiring continuous supplemental oxygen---patient meets criteria for initiation of Remdesivir AND Steroid therapy per protocol  Trended inflammatory markers including D-dimer, ferritin and  CRP--- CBC and CMP --Supplemental oxygen to keep O2 sats above 93% -Follow serial chest x-rays  and ABGs as indicated --- Encourage prone positioning  if able to tolerate --Attempt to maintain euvolemic state --Zinc and vitamin C as ordered -Albuterol inhaler as needed Treated with -IV remdesivir and IV steroids started on 01/31/2020 Will dc home on oral steroids and outpatient Iv Remdesevir infusion for another 2 days at [facility] infusion center  2) social/ethics--patient is a full code  3) hyponatremia--sodium has normalized, now up to 137- suspect dehydration related monitor closely  4)Transaminitis-- --Elevated LFTs noted-Now resolved  AST 42 >>31>>26  ALT of 86 >>33>>32 -T bili is 1.4>>1.1  5) hyperglycemia--glucose is 145, anticipate worsening hyperglycemia in the setting of steroid use -No prior diagnosis of diabetes mellitus Should resolve once off steroids   Disposition- Dc home on [place] oxygen at 4L/min  Dispo: The patient is from: Home  Anticipated d/c is to: Home Discharge Condition: stable on 4L/min of oxygen  Follow UP   Follow-up Information    Celene Squibb, MD. Schedule an appointment as soon as possible  for a visit in 2 week(s).   Specialty: Internal Medicine Why: Virtual/Video Visit--Covid Patient Contact information: [place] [address] [phone]                Diet and Activity recommendation:  As advised  Discharge Instructions     Discharge Instructions    Call MD for:  extreme fatigue   Complete by: As directed    Call MD for:  persistant dizziness or light-headedness   Complete by: As directed    Call MD for:  persistant nausea and vomiting   Complete by: As directed    Call MD for:  severe uncontrolled pain   Complete by: As directed    Call MD for:  temperature >100.4   Complete by: As directed    Diet general   Complete by: As directed    Discharge instructions   Complete by: As directed    1)You need oxygen at home at 4 L via nasal cannula continuously while awake and while asleep---  smoking or having open fires around oxygen can cause fire, significant injury and death  2)Patient scheduled for outpatient Remdesivir infusions at 11am on Wednesday 02/03/20 and Thursday 02/04/20 at [facility]. Please inform the patient to park at [place], as staff will be escorting the patient through the [place] entrance of the hospital. Appointments take approximately 45 minutes.    There is a wave flag banner located near the entrance on [address]. Turn into this entrance and immediately turn left or right and park in 1 of the 10 designated Covid Infusion Parking spots. There is a phone number on the sign, please call and let the staff know what spot you are in and we will come out and get you. For questions call [phone].  Thanks.  3)Avoid ibuprofen/Advil/Aleve/Motrin/Goody Powders/Naproxen/BC powders/Meloxicam/Diclofenac/Indomethacin and other Nonsteroidal anti-inflammatory medications as these will make you more likely to bleed and can cause stomach ulcers, can also cause Kidney problems.   4)You are strongly advised to  to isolate/quarantine for at least 21 days from the date of your diagnosis with COVID-19 infection--please always wear a mask if you have to go outside the house  5)Follow-up with your primary care physician for video/virtual visit in about 10 to 14 days for reevaluation   Increase activity slowly   Complete by: As directed         Discharge Medications     Allergies as of 02/02/2020   No Known Allergies     Medication List    STOP taking these medications   azithromycin 250 MG tablet Commonly known as: ZITHROMAX   ibuprofen 200 MG tablet Commonly known as: ADVIL   ondansetron 4 MG tablet Commonly known as: ZOFRAN   predniSONE 10 MG (21) Tbpk tablet Commonly known as: STERAPRED UNI-PAK 21 TAB Replaced by: predniSONE 50 MG tablet     TAKE these medications   acetaminophen 325 MG tablet Commonly known as: TYLENOL Take  2 tablets (650 mg total) by mouth every 6 (six) hours as needed for mild pain (or Fever >/= 101).   albuterol 108 (90 Base) MCG/ACT inhaler Commonly known as: VENTOLIN HFA Inhale 2 puffs into the lungs every 4 (four) hours as needed for wheezing or shortness of breath.   ascorbic acid 500 MG tablet Commonly known as: VITAMIN C Take 1 tablet (500 mg total) by mouth daily. Start taking on: February 03, 2020   chlorpheniramine-HYDROcodone 10-8 MG/5ML  Suer Commonly known as: TUSSIONEX Take 5 mLs by mouth every 12 (twelve) hours as needed for cough.   CLEAR EYES OP Apply 1 drop to eye daily as needed (dry eyes).   guaiFENesin-dextromethorphan 100-10 MG/5ML syrup Commonly known as: ROBITUSSIN DM Take 10 mLs by mouth every 4 (four) hours as needed for cough.   predniSONE 50 MG tablet Commonly known as: DELTASONE Take 1 tablet (50 mg total) by mouth daily with breakfast for 7 days. Replaces: predniSONE 10 MG (21) Tbpk tablet   VITAMIN D (ERGOCALCIFEROL) PO Take 1 tablet by mouth daily.   zinc sulfate 220 (50 Zn) MG capsule Take 1 capsule (220 mg total) by mouth daily. Start taking on: February 03, 2020            Durable Medical Equipment  (From admission, onward)         Start     Ordered   02/02/20 1222  For home use only DME oxygen  Once       Comments: -   SATURATION QUALIFICATIONS: (Thisnote is usedto comply with regulatory documentation for home oxygen)  Patient Saturations on Room Air at Rest =85%  Patient Saturations on [employer] while Ambulating =81%  Patient Saturations on4Liters of oxygen while Ambulating = 92%     Patient needs continuous O2 at 4 L/min continuously via nasal cannula with humidifier, with gaseous portability and conserving device  Question Answer Comment  Length of Need Lifetime   Mode or (Route) Nasal cannula   Liters per Minute 4   Frequency Continuous (stationary and portable oxygen unit needed)   Oxygen conserving device  Yes   Oxygen delivery system Gas      02/02/20 1222          Major procedures and Radiology Reports - PLEASE review detailed and final reports for all details, in brief -     DG Chest Port 1 View  Result Date: 01/31/2020 CLINICAL DATA:  Dyspnea EXAM: PORTABLE CHEST 1 VIEW COMPARISON:  None. FINDINGS: Lung volumes are small. There are bilateral mid and lower lung zone patchy pulmonary infiltrates, likely infectious or inflammatory in the acute setting. No pneumothorax or pleural effusion. Cardiac size within normal limits. No acute bone abnormality. IMPRESSION: Pulmonary hypoinflation. Superimposed bilateral infectious or inflammatory pulmonary infiltrate. Electronically Signed   By: Fidela Salisbury MD   On: 01/31/2020 02:12    Micro Results    No results found for this or any previous visit (from the past 240 hour(s)).     Today   Subjective    Jesse Wilkins today has no new concerns        Maintaining O2 sats in the low to mid 90s on 4 L while ambulating No fevers, no chills -No chest pain or pleuritic symptoms   Patient has been seen and examined prior to discharge   Objective   Blood pressure 110/65, pulse 74, temperature 98.9 F (37.2 C), temperature source Oral, resp. rate 16, height 5\' 10"  (1.778 m), weight 83.9 kg, SpO2 94 %.   Intake/Output Summary (Last 24 hours) at 02/02/2020 1518 Last data filed at 02/01/2020 1617 Gross per 24 hour  Intake 73.42 ml  Output --  Net 73.42 ml    Exam Gen:- Awake Alert, no acute distress  HEENT:- [place].AT, No sclera icterus Nose- [place] 4L/min Neck-Supple Neck,No JVD,.  Lungs-  Improved air movement, no wheezing CV- S1, S2 normal, regular Abd-  +ve B.Sounds, Abd Soft, No tenderness,    Extremity/Skin:-  No  edema,   good pulses Psych-affect is appropriate, oriented x3 Neuro-no new focal deficits, no tremors    Data Review   CBC w Diff:  Lab Results  Component Value Date   WBC 8.1 02/02/2020   HGB 14.0 02/02/2020    HCT 41.0 02/02/2020   PLT 223 02/02/2020   LYMPHOPCT 14 02/02/2020   MONOPCT 6 02/02/2020   EOSPCT 0 02/02/2020   BASOPCT 0 02/02/2020    CMP:  Lab Results  Component Value Date   NA 137 02/02/2020   K 3.9 02/02/2020   CL 102 02/02/2020   CO2 26 02/02/2020   BUN 29 (H) 02/02/2020   CREATININE 0.78 02/02/2020   PROT 6.0 (L) 02/02/2020   ALBUMIN 3.1 (L) 02/02/2020   BILITOT 1.1 02/02/2020   ALKPHOS 30 (L) 02/02/2020   AST 26 02/02/2020   ALT 32 02/02/2020     Total Discharge time is about 33 minutes  Roxan Hockey M.D on 02/02/2020 at 3:18 PM  Go to [email or web address] -  for contact info  [facility] - Office  [phone]

## 2020-02-02 NOTE — Progress Notes (Signed)
Patient scheduled for outpatient Remdesivir infusions at 11am on Wednesday 10/13 and Thursday 10/14 at [facility]. Please inform the patient to park at [place], as staff will be escorting the patient through the [place] entrance of the hospital. Appointments take approximately 45 minutes.    There is a wave flag banner located near the entrance on [address]. Turn into this entrance and immediately turn left or right and park in 1 of the 10 designated Covid Infusion Parking spots. There is a phone number on the sign, please call and let the staff know what spot you are in and we will come out and get you. For questions call [phone].  Thanks.

## 2020-02-02 NOTE — Plan of Care (Signed)

## 2020-02-02 NOTE — Discharge Instructions (Signed)
Patient scheduled for outpatient Remdesivir infusions at 11am on Wednesday 10/13 and Thursday 10/14 at [facility]. Please inform the patient to park at [place], as staff will be escorting the patient through the [place] entrance of the hospital. Appointments take approximately 45 minutes.    There is a wave flag banner located near the entrance on [address]. Turn into this entrance and immediately turn left or right and park in 1 of the 10 designated Covid Infusion Parking spots. There is a phone number on the sign, please call and let the staff know what spot you are in and we will come out and get you. For questions call [phone].  Thanks.  -- 1)You need oxygen at home at 4 L via nasal cannula continuously while awake and while asleep--- smoking or having open fires around oxygen can cause fire, significant injury and death  2)Patient scheduled for outpatient Remdesivir infusions at 11am on Wednesday 02/03/20 and Thursday 02/04/20 at [facility]. Please inform the patient to park at [place], as staff will be escorting the patient through the [place] entrance of the hospital. Appointments take approximately 45 minutes.    There is a wave flag banner located near the entrance on [address]. Turn into this entrance and immediately turn left or right and park in 1 of the 10 designated Covid Infusion Parking spots. There is a phone number on the sign, please call and let the staff know what spot you are in and we will come out and get you. For questions call [phone].  Thanks.  3)Avoid ibuprofen/Advil/Aleve/Motrin/Goody Powders/Naproxen/BC powders/Meloxicam/Diclofenac/Indomethacin and other Nonsteroidal anti-inflammatory medications as these will make you more likely to bleed and can cause stomach ulcers, can also cause Kidney problems.   4)You are strongly advised to  to isolate/quarantine for at least 21 days from the date of your diagnosis  with COVID-19 infection--please always wear a mask if you have to go outside the house  5)Follow-up with your primary care physician for video/virtual visit in about 10 to 14 days for reevaluation

## 2020-02-02 NOTE — Progress Notes (Signed)
    SATURATION QUALIFICATIONS: (Thisnote is usedto comply with regulatory documentation for home oxygen)  Patient Saturations on Room Air at Rest =85%  Patient Saturations on Room Air while Ambulating =81%  Patient Saturations on4Liters of oxygen while Ambulating = 92%     Patient needs continuous O2 at 4 L/min continuously via nasal cannula with humidifier, with gaseous portability and conserving device   Roxan Hockey, MD

## 2020-02-03 ENCOUNTER — Ambulatory Visit (HOSPITAL_COMMUNITY)
Admit: 2020-02-03 | Discharge: 2020-02-03 | Disposition: A | Discharge status: Home / Self Care | Payer: BC Managed Care – PPO | Source: Ambulatory Visit | Attending: Pulmonary Disease | Admitting: Pulmonary Disease

## 2020-02-03 DIAGNOSIS — U071 COVID-19: Secondary | ICD-10-CM | POA: Insufficient documentation

## 2020-02-03 DIAGNOSIS — J1282 Pneumonia due to coronavirus disease 2019: Secondary | ICD-10-CM | POA: Insufficient documentation

## 2020-02-03 MED ORDER — SODIUM CHLORIDE 0.9 % IV SOLN: INTRAVENOUS | Status: DC | PRN

## 2020-02-03 MED ORDER — FAMOTIDINE IN NACL 20-0.9 MG/50ML-% IV SOLN: 20.0000 mg | Freq: Once | INTRAVENOUS | Status: DC | PRN

## 2020-02-03 MED ORDER — SODIUM CHLORIDE 0.9 % IV SOLN
100.0000 mg | Freq: Once | INTRAVENOUS | Status: AC
  Administered 2020-02-03: 100 mg via INTRAVENOUS
  Filled 2020-02-03: qty 20

## 2020-02-03 MED ORDER — DIPHENHYDRAMINE HCL 50 MG/ML IJ SOLN: 50.0000 mg | Freq: Once | INTRAMUSCULAR | Status: DC | PRN

## 2020-02-03 MED ORDER — METHYLPREDNISOLONE SODIUM SUCC 125 MG IJ SOLR: 125.0000 mg | Freq: Once | INTRAMUSCULAR | Status: DC | PRN

## 2020-02-03 MED ORDER — EPINEPHRINE 0.3 MG/0.3ML IJ SOAJ: 0.3000 mg | Freq: Once | INTRAMUSCULAR | Status: DC | PRN

## 2020-02-03 MED ORDER — ALBUTEROL SULFATE HFA 108 (90 BASE) MCG/ACT IN AERS: 2.0000 | INHALATION_SPRAY | Freq: Once | RESPIRATORY_TRACT | Status: DC | PRN

## 2020-02-03 NOTE — Discharge Instructions (Signed)
10 Things You Can Do to Manage Your COVID-19 Symptoms at Home If you have possible or confirmed COVID-19: 1. Stay home from work and school. And stay away from other public places. If you must go out, avoid using any kind of public transportation, ridesharing, or taxis. 2. Monitor your symptoms carefully. If your symptoms get worse, call your healthcare provider immediately. 3. Get rest and stay hydrated. 4. If you have a medical appointment, call the healthcare provider ahead of time and tell them that you have or may have COVID-19. 5. For medical emergencies, call 911 and notify the dispatch personnel that you have or may have COVID-19. 6. Cover your cough and sneezes with a tissue or use the inside of your elbow. 7. Wash your hands often with soap and water for at least 20 seconds or clean your hands with an alcohol-based hand sanitizer that contains at least 60% alcohol. 8. As much as possible, stay in a specific room and away from other people in your home. Also, you should use a separate bathroom, if available. If you need to be around other people in or outside of the home, wear a mask. 9. Avoid sharing personal items with other people in your household, like dishes, towels, and bedding. 10. Clean all surfaces that are touched often, like counters, tabletops, and doorknobs. Use household cleaning sprays or wipes according to the label instructions. cdc.gov/coronavirus 10/22/2018 This information is not intended to replace advice given to you by your health care provider. Make sure you discuss any questions you have with your health care provider. Document Revised: 03/26/2019 Document Reviewed: 03/26/2019 Elsevier Patient Education  2020 Elsevier Inc.  

## 2020-02-03 NOTE — Progress Notes (Signed)
  Diagnosis: COVID-19  Physician:Dr Joya Gaskins   Procedure: Covid Infusion Clinic Med: remdesivir infusion - Provided patient with remdesivir fact sheet for patients, parents and caregivers prior to infusion.  Complications: No immediate complications noted.  Discharge: Discharged home   Dewaine Oats 02/03/2020

## 2020-02-04 ENCOUNTER — Ambulatory Visit (HOSPITAL_COMMUNITY)
Admit: 2020-02-04 | Discharge: 2020-02-04 | Disposition: A | Discharge status: Home / Self Care | Payer: BC Managed Care – PPO | Source: Ambulatory Visit | Attending: Pulmonary Disease | Admitting: Pulmonary Disease

## 2020-02-04 DIAGNOSIS — U071 COVID-19: Secondary | ICD-10-CM | POA: Insufficient documentation

## 2020-02-04 DIAGNOSIS — J9601 Acute respiratory failure with hypoxia: Secondary | ICD-10-CM | POA: Diagnosis present

## 2020-02-04 DIAGNOSIS — J1282 Pneumonia due to coronavirus disease 2019: Secondary | ICD-10-CM | POA: Insufficient documentation

## 2020-02-04 MED ORDER — SODIUM CHLORIDE 0.9 % IV SOLN
100.0000 mg | Freq: Once | INTRAVENOUS | Status: AC
  Administered 2020-02-04: 100 mg via INTRAVENOUS

## 2020-02-04 MED ORDER — EPINEPHRINE 0.3 MG/0.3ML IJ SOAJ: 0.3000 mg | Freq: Once | INTRAMUSCULAR | Status: DC | PRN

## 2020-02-04 MED ORDER — ALBUTEROL SULFATE HFA 108 (90 BASE) MCG/ACT IN AERS: 2.0000 | INHALATION_SPRAY | Freq: Once | RESPIRATORY_TRACT | Status: DC | PRN

## 2020-02-04 MED ORDER — SODIUM CHLORIDE 0.9 % IV SOLN: INTRAVENOUS | Status: DC | PRN

## 2020-02-04 MED ORDER — DIPHENHYDRAMINE HCL 50 MG/ML IJ SOLN: 50.0000 mg | Freq: Once | INTRAMUSCULAR | Status: DC | PRN

## 2020-02-04 MED ORDER — METHYLPREDNISOLONE SODIUM SUCC 125 MG IJ SOLR: 125.0000 mg | Freq: Once | INTRAMUSCULAR | Status: DC | PRN

## 2020-02-04 MED ORDER — FAMOTIDINE IN NACL 20-0.9 MG/50ML-% IV SOLN: 20.0000 mg | Freq: Once | INTRAVENOUS | Status: DC | PRN

## 2020-02-04 NOTE — Discharge Instructions (Signed)

## 2020-02-04 NOTE — Progress Notes (Signed)
  Diagnosis: COVID-19  Physician:dr wright  Procedure: Covid Infusion Clinic Med: remdesivir infusion - Provided patient with remdesivir fact sheet for patients, parents and caregivers prior to infusion.  Complications: No immediate complications noted.  Discharge: Discharged home   [place] 02/04/2020

## 2020-02-09 NOTE — Progress Notes (Signed)
BMI was 26.54. Z68.26.  Please let me know if you need additional information.  Thanks

## 2020-02-10 NOTE — Progress Notes (Signed)
Jesse Wilkins has a BMI of 26 which qualifies him for infusion.

## 2020-02-10 NOTE — Progress Notes (Signed)
Mr. Hafley has a BMI of 26 which qualifies him for infusion.

## 2020-02-17 ENCOUNTER — Other Ambulatory Visit (HOSPITAL_COMMUNITY)
Admission: RE | Admit: 2020-02-17 | Discharge: 2020-02-17 | Disposition: A | Discharge status: Home / Self Care | Payer: BC Managed Care – PPO | Source: Ambulatory Visit | Attending: Internal Medicine | Admitting: Internal Medicine

## 2020-02-17 ENCOUNTER — Other Ambulatory Visit (HOSPITAL_COMMUNITY): Payer: Self-pay | Admitting: Internal Medicine

## 2020-02-17 ENCOUNTER — Ambulatory Visit (HOSPITAL_COMMUNITY)
Admission: RE | Admit: 2020-02-17 | Discharge: 2020-02-17 | Disposition: A | Discharge status: Home / Self Care | Payer: BC Managed Care – PPO | Source: Ambulatory Visit | Attending: Internal Medicine | Admitting: Internal Medicine

## 2020-02-17 ENCOUNTER — Other Ambulatory Visit: Payer: Self-pay

## 2020-02-17 DIAGNOSIS — U071 COVID-19: Secondary | ICD-10-CM | POA: Diagnosis not present

## 2020-02-17 DIAGNOSIS — J90 Pleural effusion, not elsewhere classified: Secondary | ICD-10-CM | POA: Diagnosis not present

## 2020-02-17 DIAGNOSIS — J189 Pneumonia, unspecified organism: Secondary | ICD-10-CM | POA: Diagnosis not present

## 2020-02-17 DIAGNOSIS — Z6829 Body mass index (BMI) 29.0-29.9, adult: Secondary | ICD-10-CM | POA: Diagnosis not present

## 2020-02-17 DIAGNOSIS — Z23 Encounter for immunization: Secondary | ICD-10-CM | POA: Diagnosis not present

## 2020-02-17 DIAGNOSIS — J1281 Pneumonia due to SARS-associated coronavirus: Secondary | ICD-10-CM | POA: Insufficient documentation

## 2020-02-17 DIAGNOSIS — J9601 Acute respiratory failure with hypoxia: Secondary | ICD-10-CM | POA: Insufficient documentation

## 2020-02-17 DIAGNOSIS — R945 Abnormal results of liver function studies: Secondary | ICD-10-CM | POA: Diagnosis not present

## 2020-02-17 DIAGNOSIS — Z20828 Contact with and (suspected) exposure to other viral communicable diseases: Secondary | ICD-10-CM | POA: Diagnosis not present

## 2020-02-17 DIAGNOSIS — H6502 Acute serous otitis media, left ear: Secondary | ICD-10-CM | POA: Diagnosis not present

## 2020-02-17 DIAGNOSIS — J019 Acute sinusitis, unspecified: Secondary | ICD-10-CM | POA: Diagnosis not present

## 2020-02-17 DIAGNOSIS — R0602 Shortness of breath: Secondary | ICD-10-CM | POA: Diagnosis not present

## 2020-02-17 LAB — BLOOD GAS, ARTERIAL
Acid-Base Excess: 0.8 mmol/L (ref 0.0–2.0)
Bicarbonate: 25.3 mmol/L (ref 20.0–28.0)
FIO2: 21
O2 Saturation: 97.9 %
Patient temperature: 37
pCO2 arterial: 38.2 mmHg (ref 32.0–48.0)
pH, Arterial: 7.426 (ref 7.350–7.450)
pO2, Arterial: 106 mmHg (ref 83.0–108.0)

## 2020-03-10 DIAGNOSIS — Z23 Encounter for immunization: Secondary | ICD-10-CM | POA: Diagnosis not present

## 2020-03-10 DIAGNOSIS — Z0001 Encounter for general adult medical examination with abnormal findings: Secondary | ICD-10-CM | POA: Diagnosis not present

## 2020-03-14 ENCOUNTER — Encounter (INDEPENDENT_AMBULATORY_CARE_PROVIDER_SITE_OTHER): Payer: Self-pay | Admitting: *Deleted

## 2020-03-14 DIAGNOSIS — Z23 Encounter for immunization: Secondary | ICD-10-CM | POA: Diagnosis not present

## 2020-03-14 DIAGNOSIS — E785 Hyperlipidemia, unspecified: Secondary | ICD-10-CM | POA: Diagnosis not present

## 2020-03-14 DIAGNOSIS — Z20828 Contact with and (suspected) exposure to other viral communicable diseases: Secondary | ICD-10-CM | POA: Diagnosis not present

## 2020-03-14 DIAGNOSIS — E663 Overweight: Secondary | ICD-10-CM | POA: Diagnosis not present

## 2020-03-14 DIAGNOSIS — H6502 Acute serous otitis media, left ear: Secondary | ICD-10-CM | POA: Diagnosis not present

## 2020-03-14 DIAGNOSIS — J019 Acute sinusitis, unspecified: Secondary | ICD-10-CM | POA: Diagnosis not present

## 2020-03-21 ENCOUNTER — Other Ambulatory Visit (HOSPITAL_COMMUNITY): Payer: Self-pay | Admitting: Internal Medicine

## 2020-03-21 DIAGNOSIS — S99922A Unspecified injury of left foot, initial encounter: Secondary | ICD-10-CM | POA: Diagnosis not present

## 2020-03-21 DIAGNOSIS — M79675 Pain in left toe(s): Secondary | ICD-10-CM | POA: Diagnosis not present

## 2020-03-21 DIAGNOSIS — M7989 Other specified soft tissue disorders: Secondary | ICD-10-CM

## 2020-03-21 DIAGNOSIS — M7752 Other enthesopathy of left foot: Secondary | ICD-10-CM | POA: Diagnosis not present

## 2020-03-23 ENCOUNTER — Other Ambulatory Visit (HOSPITAL_COMMUNITY): Payer: Self-pay | Admitting: Podiatry

## 2020-03-23 DIAGNOSIS — M7989 Other specified soft tissue disorders: Secondary | ICD-10-CM

## 2020-03-30 ENCOUNTER — Other Ambulatory Visit: Payer: Self-pay

## 2020-03-30 ENCOUNTER — Ambulatory Visit (HOSPITAL_COMMUNITY)
Admission: RE | Admit: 2020-03-30 | Discharge: 2020-03-30 | Disposition: A | Discharge status: Home / Self Care | Payer: BC Managed Care – PPO | Source: Ambulatory Visit | Attending: Internal Medicine | Admitting: Internal Medicine

## 2020-03-30 DIAGNOSIS — M7989 Other specified soft tissue disorders: Secondary | ICD-10-CM | POA: Diagnosis not present

## 2020-03-30 DIAGNOSIS — M19072 Primary osteoarthritis, left ankle and foot: Secondary | ICD-10-CM | POA: Diagnosis not present

## 2020-03-30 MED ORDER — GADOBUTROL 1 MMOL/ML IV SOLN
8.0000 mL | Freq: Once | INTRAVENOUS | Status: AC | PRN
  Administered 2020-03-30: 8 mL via INTRAVENOUS

## 2020-04-05 ENCOUNTER — Telehealth: Payer: Self-pay | Admitting: Podiatry

## 2020-04-05 ENCOUNTER — Ambulatory Visit (INDEPENDENT_AMBULATORY_CARE_PROVIDER_SITE_OTHER): Payer: BC Managed Care – PPO | Admitting: Podiatry

## 2020-04-05 ENCOUNTER — Ambulatory Visit (INDEPENDENT_AMBULATORY_CARE_PROVIDER_SITE_OTHER): Payer: BC Managed Care – PPO

## 2020-04-05 ENCOUNTER — Other Ambulatory Visit: Payer: Self-pay

## 2020-04-05 DIAGNOSIS — M79672 Pain in left foot: Secondary | ICD-10-CM

## 2020-04-05 DIAGNOSIS — Q278 Other specified congenital malformations of peripheral vascular system: Secondary | ICD-10-CM

## 2020-04-05 DIAGNOSIS — I999 Unspecified disorder of circulatory system: Secondary | ICD-10-CM | POA: Diagnosis not present

## 2020-04-05 NOTE — Progress Notes (Signed)
Subjective:  Patient ID: Jesse Wilkins, male    DOB: 1970/05/12,  MRN: [id number]  Chief Complaint  Patient presents with  . Foot Pain    Left forefoot pain. PT stated it feels like a ball in his foot and like he is walking on a rock.    49 y.o. male presents with the above complaint. History confirmed with patient.  Completed MRI at [facility].  He first noticed it a few months ago in September.  It got better for a little while he was hospitalized in quarantine for COVID-19.  Feels worse on hardwood floors.  Objective:  Physical Exam: warm, good capillary refill, no trophic changes or ulcerative lesions, normal DP and PT pulses and normal sensory exam. Left Foot: Palpable mass just proximal to the metatarsals of the second and third MTPJ's has a palpable click.  Does not produce neuritic symptoms   Radiographs: X-ray of the left foot: no fracture, dislocation, swelling or degenerative changes noted   Study Result  Narrative & Impression  CLINICAL DATA:  Palpable mass at the plantar aspect of the left forefoot for 3 months  EXAM: MRI OF THE LEFT FOREFOOT WITHOUT AND WITH CONTRAST  TECHNIQUE: Multiplanar, multisequence MR imaging of the left forefoot was performed both before and after administration of intravenous contrast.  CONTRAST:  53mL GADAVIST GADOBUTROL 1 MMOL/ML IV SOLN  COMPARISON:  None.  FINDINGS: Bones/Joint/Cartilage  No acute fracture. No dislocation. Subchondral cystic changes within both hallux sesamoids. Mild first MTP joint space narrowing. No bone marrow edema. No suspicious bone lesion. No joint effusions.  Ligaments  Intact Lisfranc ligament. Collateral ligaments of the forefoot are intact. Intact plantar plates.  Muscles and Tendons  Flexor and extensor tendons are intact without tendinosis, tear, or tenosynovitis. Normal muscle bulk and signal intensity without edema, atrophy, or fatty infiltration.  Soft  tissues  No intermetatarsal space mass or fluid collection. No soft tissue ulceration. No edema or fluid collection. The plantar aspect of the distal forefoot underlying the level of the second metatarsal neck is a rounded 7 mm nodular lesion with areas of internal T1 hyperintensity (series 6, image 24). There is peripheral and some heterogeneous internal enhancement within this lesion on postcontrast images (series 10, image 24). Along the proximal margin there appears to be a small vascular structure extending into the lesion (series 10, images 27-28). Soft tissues are otherwise unremarkable.  IMPRESSION: 1. Rounded 7 mm nodular lesion at the plantar aspect of the distal forefoot underlying the level of the second metatarsal neck with areas of internal fat density and peripheral enhancement. Findings are nonspecific and may represent a small vascular malformation such as a hemangioma, or possibly a focal area of fat necrosis. A follow-up MRI could be performed in 6-12 months to ensure stability. 2. No acute osseous abnormality. 3. 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   Electronically Signed   By: Davina Poke D.O.   On: 03/31/2020 08:34     Assessment:   1. Foot pain, left   2. Vascular malformation of lower extremity      Plan:  Patient was evaluated and treated and all questions answered.  Reviewed MRI with patient as well as his radiographs.  Clinically is in the location and feels similar to a neuroma but does not produce any neuritic symptoms.  The MRI seems to direct [place] toward some sort of vascular malformation.  Discussed etiology and treatment options for these including watching and waiting and monitoring  the lesion to see if it worsens.  We discussed that malignancy in the foot and ankle is incredibly rare but it does happen.  It has become increasingly bothersome for him he would like to have it excised.  We discussed the risk,  benefits and potential complications of this.  Specifically we discussed the risk of infection as well as plantar wound complication or a painful or thick scar on the bottom of the foot.  He understands the risks and would like to proceed with surgical excision.  Informed consent was signed and reviewed in the office today will be scheduled for next Tuesday.   Surgical plan:  Procedure: -Excision of subfascial soft tissue mass left foot  Location: -[place]  Anesthesia plan: -IV sedation with local anesthesia  Postoperative pain plan: - Tylenol 1000 mg every 6 hours, ibuprofen 600 mg every 6 hours, gabapentin 300 mg every 8 hours x5 days, oxycodone 5 mg 1-2 tabs every 6 hours only as needed  DVT prophylaxis: -None required  WB Restrictions / DME needs: -NWB LLE in short CAM boot   No follow-ups on file.

## 2020-04-05 NOTE — Telephone Encounter (Signed)
DOS: 04/12/2020  Procedure: Exc of Mass Bottom of Foot Left [phone])  BCBS Effective From 04/23/2018 - 04/22/9998  Deductible: $1,500 with $1,500 met and $0 remaining. Out of Pocket: $2,500 with $2,500 met and $0 remaining. CoInsurance: 100% Copay: $0  Per [employer] no Prior Authorization is required.

## 2020-04-06 ENCOUNTER — Encounter: Payer: Self-pay | Admitting: Podiatry

## 2020-04-12 ENCOUNTER — Other Ambulatory Visit: Payer: Self-pay | Admitting: Podiatry

## 2020-04-12 DIAGNOSIS — M71372 Other bursal cyst, left ankle and foot: Secondary | ICD-10-CM | POA: Diagnosis not present

## 2020-04-12 DIAGNOSIS — Q278 Other specified congenital malformations of peripheral vascular system: Secondary | ICD-10-CM | POA: Diagnosis not present

## 2020-04-12 DIAGNOSIS — D2122 Benign neoplasm of connective and other soft tissue of left lower limb, including hip: Secondary | ICD-10-CM | POA: Diagnosis not present

## 2020-04-12 MED ORDER — OXYCODONE HCL 5 MG PO TABS
5.0000 mg | ORAL_TABLET | ORAL | 0 refills | Status: AC | PRN
Start: 2020-04-12 — End: 2020-04-19

## 2020-04-12 MED ORDER — ACETAMINOPHEN 500 MG PO TABS: 1000.0000 mg | ORAL_TABLET | Freq: Four times a day (QID) | ORAL | 0 refills | Status: AC | PRN

## 2020-04-12 MED ORDER — IBUPROFEN 600 MG PO TABS: 600.0000 mg | ORAL_TABLET | Freq: Four times a day (QID) | ORAL | 0 refills | Status: AC | PRN

## 2020-04-12 MED ORDER — GABAPENTIN 300 MG PO CAPS: 300.0000 mg | ORAL_CAPSULE | Freq: Three times a day (TID) | ORAL | 0 refills | Status: AC

## 2020-04-12 NOTE — Progress Notes (Unsigned)
12/21 [place] L foot mass excision

## 2020-04-18 ENCOUNTER — Other Ambulatory Visit: Payer: Self-pay | Admitting: Podiatry

## 2020-04-18 DIAGNOSIS — I999 Unspecified disorder of circulatory system: Secondary | ICD-10-CM

## 2020-04-19 ENCOUNTER — Other Ambulatory Visit: Payer: Self-pay

## 2020-04-19 ENCOUNTER — Ambulatory Visit (INDEPENDENT_AMBULATORY_CARE_PROVIDER_SITE_OTHER): Payer: BC Managed Care – PPO | Admitting: Podiatry

## 2020-04-19 DIAGNOSIS — Z9889 Other specified postprocedural states: Secondary | ICD-10-CM

## 2020-04-19 DIAGNOSIS — M7989 Other specified soft tissue disorders: Secondary | ICD-10-CM

## 2020-04-19 NOTE — Progress Notes (Signed)
  Subjective:  Patient ID: Jesse Wilkins, male    DOB: 10/28/1970,  MRN: [id number]  Chief Complaint  Patient presents with  . Routine Post Op    Pt stated that he is doing well he had some pain the first few days after surgery but the pain is better now.    DOS: 04/12/2020 Procedure: Excision of soft tissue mass plantar left foot  49 y.o. male returns for post-op check.  Feeling well.  Notes minimal pain.  Review of Systems: Negative except as noted in the HPI. Denies N/V/F/Ch.   Objective:  There were no vitals filed for this visit. There is no height or weight on file to calculate BMI. Constitutional Well developed. Well nourished.  Vascular Foot warm and well perfused. Capillary refill normal to all digits.   Neurologic Normal speech. Oriented to person, place, and time. Epicritic sensation to light touch grossly present bilaterally.  Dermatologic Skin healing well without signs of infection. Skin edges well coapted without signs of infection.  Orthopedic: Tenderness to palpation noted about the surgical site.   Pathology report is not finalized Assessment:   1. Mass of soft tissue of foot   2. Post-operative state    Plan:  Patient was evaluated and treated and all questions answered.  S/p foot surgery left -Progressing as expected post-operatively. -Hopeful to have pathology report soon.  Discussed with him that if this appeared to be consistent with either a lipoma or a giant cell tumor of the tendon sheath -WB Status: NWB in CAM boot -Sutures: We will remove at 3 weeks postop. -Medications: No refills required -Foot redressed.  Return in about 2 weeks (around 05/03/2020) for suture removal.

## 2020-04-21 ENCOUNTER — Encounter: Payer: Self-pay | Admitting: Podiatry

## 2020-04-26 ENCOUNTER — Encounter: Payer: BC Managed Care – PPO | Admitting: Podiatry

## 2020-05-03 ENCOUNTER — Ambulatory Visit (INDEPENDENT_AMBULATORY_CARE_PROVIDER_SITE_OTHER): Payer: BC Managed Care – PPO | Admitting: Podiatry

## 2020-05-03 ENCOUNTER — Other Ambulatory Visit: Payer: Self-pay

## 2020-05-03 DIAGNOSIS — Z9889 Other specified postprocedural states: Secondary | ICD-10-CM

## 2020-05-03 DIAGNOSIS — M7989 Other specified soft tissue disorders: Secondary | ICD-10-CM

## 2020-05-04 ENCOUNTER — Encounter: Payer: Self-pay | Admitting: Podiatry

## 2020-05-04 NOTE — Progress Notes (Signed)
  Subjective:  Patient ID: Jesse Wilkins, male    DOB: [birth year],  MRN: [id number]  Chief Complaint  Patient presents with  . Routine Post Op    PT stated that he is doing well he has no concerns     DOS: 04/12/2020 Procedure: Excision of soft tissue mass plantar left foot  50 y.o. male returns for post-op check.  Feeling well.   Review of Systems: Negative except as noted in the HPI. Denies N/V/F/Ch.   Objective:  There were no vitals filed for this visit. There is no height or weight on file to calculate BMI. Constitutional Well developed. Well nourished.  Vascular Foot warm and well perfused. Capillary refill normal to all digits.   Neurologic Normal speech. Oriented to person, place, and time. Epicritic sensation to light touch grossly present bilaterally.  Dermatologic Well healed surgicaa incision  Orthopedic: Tenderness to palpation noted about the surgical site.    Assessment:   No diagnosis found. Plan:  Patient was evaluated and treated and all questions answered.  S/p foot surgery left -Progressing as expected post-operatively. -WB Status: transition to WBAT in regular shoes -Sutures: removed today, steri strips applied -Medications: No refills required   Return in about 6 weeks (around 06/14/2020).

## 2020-05-10 ENCOUNTER — Encounter: Payer: BC Managed Care – PPO | Admitting: Podiatry

## 2020-06-14 ENCOUNTER — Encounter: Payer: BC Managed Care – PPO | Admitting: Podiatry

## 2020-06-21 ENCOUNTER — Ambulatory Visit (INDEPENDENT_AMBULATORY_CARE_PROVIDER_SITE_OTHER): Payer: BC Managed Care – PPO | Admitting: Podiatry

## 2020-06-21 ENCOUNTER — Other Ambulatory Visit: Payer: Self-pay

## 2020-06-21 ENCOUNTER — Encounter: Payer: BC Managed Care – PPO | Admitting: Podiatry

## 2020-06-21 DIAGNOSIS — D1801 Hemangioma of skin and subcutaneous tissue: Secondary | ICD-10-CM

## 2020-06-21 DIAGNOSIS — Z9889 Other specified postprocedural states: Secondary | ICD-10-CM

## 2020-06-21 DIAGNOSIS — M7989 Other specified soft tissue disorders: Secondary | ICD-10-CM

## 2020-06-22 ENCOUNTER — Encounter: Payer: Self-pay | Admitting: Podiatry

## 2020-06-22 NOTE — Progress Notes (Signed)
  Subjective:  Patient ID: Jesse Wilkins, male    DOB: March 12, 1971,  MRN: [id number]  Chief Complaint  Patient presents with  . Routine Post Op    POV- left foot - Pts pain is 2-3/10  sore - toes are numb- middle toe has no feeling -foot is swollen pt states it's sore when he walks.      DOS: 04/12/2020 Procedure: Excision of soft tissue mass plantar left foot  50 y.o. male returns for post-op check.  Feeling well.  He is concerned about the swelling and the numbness in the toes  Review of Systems: Negative except as noted in the HPI. Denies N/V/F/Ch.   Objective:  There were no vitals filed for this visit. There is no height or weight on file to calculate BMI. Constitutional Well developed. Well nourished.  Vascular Foot warm and well perfused. Capillary refill normal to all digits.   Neurologic Normal speech. Oriented to person, place, and time. Reduced sensation to light touch plantar lateral second toe, medial plantar third toe along the incision  Dermatologic Well healed surgical incision, still some thickness and edema in the area  Orthopedic: Tenderness to palpation noted about the surgical site.    Assessment:   1. Hemangioma of subcutaneous tissue   2. Mass of soft tissue of foot   3. Post-operative state    Plan:  Patient was evaluated and treated and all questions answered.  S/p foot surgery left -Reviewed his postoperative course, discussed with him that the numbness he is experiencing is hopefully something that will recover and improve.  The hemangioma reexcised did originate from the neurovascular bundle and was closely associated with the nerve.  Discussed with him that I did try my best to remove the mass without damaging the nerve, I think we should try to give this another 2 months or so to see if it improves, if not improving by then could consider a steroid injection along the incision along the nerve to see if this improves and freeze up some scar  tissue around it.  If is not better in 9 to 12 months after surgery he likely will be permanent.  Overall his pain is better than it was prior to surgery   Return in about 2 months (around 08/21/2020).

## 2020-06-29 ENCOUNTER — Telehealth (INDEPENDENT_AMBULATORY_CARE_PROVIDER_SITE_OTHER): Payer: Self-pay

## 2020-06-29 ENCOUNTER — Other Ambulatory Visit (INDEPENDENT_AMBULATORY_CARE_PROVIDER_SITE_OTHER): Payer: Self-pay

## 2020-06-29 DIAGNOSIS — Z1211 Encounter for screening for malignant neoplasm of colon: Secondary | ICD-10-CM

## 2020-06-29 MED ORDER — PLENVU 140 G PO SOLR: 1.0000 | Freq: Once | ORAL | 0 refills | Status: AC

## 2020-06-29 NOTE — Telephone Encounter (Signed)
[place]

## 2020-06-29 NOTE — Telephone Encounter (Signed)
Ok to schedule.  Thanks,  Kayler Rise Castaneda Mayorga, MD Gastroenterology and Hepatology [facility]  

## 2020-06-29 NOTE — Telephone Encounter (Signed)
Referring MD/PCP: [place] Crane   Procedure: screening TCS  Reason/Indication:  Screening   Has patient had this procedure before?  No  If so, when, by whom and where?    Is there a family history of colon cancer?  No  Who?  What age when diagnosed?    Is patient diabetic?   No       Does patient have prosthetic heart valve or mechanical valve?  No   Do you have a pacemaker/defibrillator?  No  Has patient ever had endocarditis/atrial fibrillation? No  Have you had a stroke/heart attack last 6 mths? No   Does patient use oxygen? No  Has patient had joint replacement within last 12 months?  No  Is patient constipated or do they take laxatives? No  Does patient have a history of alcohol/drug use?  No  Is patient on blood thinner such as Coumadin, Plavix and/or Aspirin? No  Do you take medicine for weight loss. No  Medications: Zinc daily, Vit C daily, Vit D daily, mens one a day vit.  Allergies: Nkda   Procedure date & time: 09/13/2020 Am

## 2020-08-15 ENCOUNTER — Other Ambulatory Visit (HOSPITAL_COMMUNITY): Payer: BC Managed Care – PPO

## 2020-09-06 ENCOUNTER — Ambulatory Visit (INDEPENDENT_AMBULATORY_CARE_PROVIDER_SITE_OTHER): Payer: BC Managed Care – PPO | Admitting: Podiatry

## 2020-09-06 ENCOUNTER — Other Ambulatory Visit: Payer: Self-pay

## 2020-09-06 ENCOUNTER — Encounter: Payer: Self-pay | Admitting: Podiatry

## 2020-09-06 DIAGNOSIS — E782 Mixed hyperlipidemia: Secondary | ICD-10-CM | POA: Diagnosis not present

## 2020-09-06 DIAGNOSIS — D1801 Hemangioma of skin and subcutaneous tissue: Secondary | ICD-10-CM

## 2020-09-06 DIAGNOSIS — R200 Anesthesia of skin: Secondary | ICD-10-CM

## 2020-09-06 DIAGNOSIS — R208 Other disturbances of skin sensation: Secondary | ICD-10-CM

## 2020-09-06 NOTE — Progress Notes (Signed)
  Subjective:  Patient ID: Jesse Wilkins, male    DOB: August 15, 1970,  MRN: [id number]  Chief Complaint  Patient presents with  . Foot Pain    S/P excision of soft tissue mass, left foot, DOS  04/12/20    DOS: 04/12/2020 Procedure: Excision of soft tissue mass plantar left foot  50 y.o. male returns for post-op check.  Feeling well.  Swelling and scars is improved he still has significant numbness that is not getting better mostly on the third toe  Review of Systems: Negative except as noted in the HPI. Denies N/V/F/Ch.   Objective:  There were no vitals filed for this visit. There is no height or weight on file to calculate BMI. Constitutional Well developed. Well nourished.  Vascular Foot warm and well perfused. Capillary refill normal to all digits.   Neurologic Normal speech. Oriented to person, place, and time. Reduced sensation to light touch plantar lateral second toe, medial plantar third toe along the incision  Dermatologic Well healed surgical incision, still some thickness and edema in the area  Orthopedic:  No pain to palpation noted about the surgical site.    Assessment:   1. Hemangioma of subcutaneous tissue   2. Numbness of left foot    Plan:  Patient was evaluated and treated and all questions answered.  S/p foot surgery left -Unfortunate I do not think the numbness will improve at this point.  We also discussed the option of a corticosteroid injection to see if this alleviates but I think more than likely it will not improve his symptoms.  He will continue to monitor, if it is still the same way 1 year after surgery I do not think it likely will improve.  I think likely the mass was entangled in some of the digital nerve tissue and this would not recover.  I discussed with him the issue of this obviously being his problems with proprioception and he hopefully will adapt to this and should focus on using his big toe for proprioception   Return in about 7  months (around 04/08/2021), or if symptoms worsen or fail to improve.

## 2020-09-12 ENCOUNTER — Other Ambulatory Visit (HOSPITAL_COMMUNITY): Payer: BC Managed Care – PPO

## 2020-09-13 ENCOUNTER — Ambulatory Visit (HOSPITAL_COMMUNITY): Admit: 2020-09-13 | Payer: BC Managed Care – PPO | Admitting: Gastroenterology

## 2020-09-13 ENCOUNTER — Encounter (HOSPITAL_COMMUNITY): Payer: Self-pay

## 2020-09-13 SURGERY — COLONOSCOPY WITH PROPOFOL
Anesthesia: Monitor Anesthesia Care

## 2020-09-20 DIAGNOSIS — R945 Abnormal results of liver function studies: Secondary | ICD-10-CM | POA: Diagnosis not present

## 2020-09-20 DIAGNOSIS — E785 Hyperlipidemia, unspecified: Secondary | ICD-10-CM | POA: Diagnosis not present

## 2020-09-20 DIAGNOSIS — E669 Obesity, unspecified: Secondary | ICD-10-CM | POA: Diagnosis not present

## 2020-10-21 DIAGNOSIS — E669 Obesity, unspecified: Secondary | ICD-10-CM | POA: Diagnosis not present

## 2021-03-07 DIAGNOSIS — E785 Hyperlipidemia, unspecified: Secondary | ICD-10-CM | POA: Diagnosis not present

## 2021-03-14 DIAGNOSIS — Z0001 Encounter for general adult medical examination with abnormal findings: Secondary | ICD-10-CM | POA: Diagnosis not present

## 2021-04-04 ENCOUNTER — Ambulatory Visit: Payer: BC Managed Care – PPO | Admitting: Podiatry

## 2021-04-18 DIAGNOSIS — J019 Acute sinusitis, unspecified: Secondary | ICD-10-CM | POA: Diagnosis not present

## 2021-05-22 DIAGNOSIS — J309 Allergic rhinitis, unspecified: Secondary | ICD-10-CM | POA: Diagnosis not present

## 2021-09-12 DIAGNOSIS — U071 COVID-19: Secondary | ICD-10-CM | POA: Diagnosis not present

## 2021-09-12 DIAGNOSIS — R059 Cough, unspecified: Secondary | ICD-10-CM | POA: Diagnosis not present

## 2021-09-12 DIAGNOSIS — M791 Myalgia, unspecified site: Secondary | ICD-10-CM | POA: Diagnosis not present

## 2021-09-12 DIAGNOSIS — R6883 Chills (without fever): Secondary | ICD-10-CM | POA: Diagnosis not present

## 2022-01-24 DIAGNOSIS — Z23 Encounter for immunization: Secondary | ICD-10-CM | POA: Diagnosis not present

## 2022-03-13 DIAGNOSIS — J069 Acute upper respiratory infection, unspecified: Secondary | ICD-10-CM | POA: Diagnosis not present

## 2022-03-19 DIAGNOSIS — E785 Hyperlipidemia, unspecified: Secondary | ICD-10-CM | POA: Diagnosis not present

## 2022-03-19 DIAGNOSIS — Z0184 Encounter for antibody response examination: Secondary | ICD-10-CM | POA: Diagnosis not present

## 2022-03-19 DIAGNOSIS — Z125 Encounter for screening for malignant neoplasm of prostate: Secondary | ICD-10-CM | POA: Diagnosis not present

## 2022-03-22 DIAGNOSIS — E785 Hyperlipidemia, unspecified: Secondary | ICD-10-CM | POA: Diagnosis not present

## 2022-03-22 DIAGNOSIS — Z0000 Encounter for general adult medical examination without abnormal findings: Secondary | ICD-10-CM | POA: Diagnosis not present

## 2022-03-22 DIAGNOSIS — Z125 Encounter for screening for malignant neoplasm of prostate: Secondary | ICD-10-CM | POA: Diagnosis not present

## 2022-03-22 DIAGNOSIS — N529 Male erectile dysfunction, unspecified: Secondary | ICD-10-CM | POA: Diagnosis not present

## 2022-04-04 DIAGNOSIS — J019 Acute sinusitis, unspecified: Secondary | ICD-10-CM | POA: Diagnosis not present

## 2022-04-04 DIAGNOSIS — R059 Cough, unspecified: Secondary | ICD-10-CM | POA: Diagnosis not present

## 2023-01-30 DIAGNOSIS — Z23 Encounter for immunization: Secondary | ICD-10-CM | POA: Diagnosis not present

## 2023-02-08 DIAGNOSIS — F5221 Male erectile disorder: Secondary | ICD-10-CM | POA: Diagnosis not present

## 2023-02-08 DIAGNOSIS — H9313 Tinnitus, bilateral: Secondary | ICD-10-CM | POA: Diagnosis not present

## 2023-04-29 DIAGNOSIS — Z125 Encounter for screening for malignant neoplasm of prostate: Secondary | ICD-10-CM | POA: Diagnosis not present

## 2023-04-29 DIAGNOSIS — E785 Hyperlipidemia, unspecified: Secondary | ICD-10-CM | POA: Diagnosis not present

## 2023-05-03 DIAGNOSIS — E669 Obesity, unspecified: Secondary | ICD-10-CM | POA: Diagnosis not present

## 2023-05-03 DIAGNOSIS — E785 Hyperlipidemia, unspecified: Secondary | ICD-10-CM | POA: Diagnosis not present

## 2023-05-03 DIAGNOSIS — Z0001 Encounter for general adult medical examination with abnormal findings: Secondary | ICD-10-CM | POA: Diagnosis not present

## 2023-05-03 DIAGNOSIS — H9313 Tinnitus, bilateral: Secondary | ICD-10-CM | POA: Diagnosis not present

## 2023-05-03 DIAGNOSIS — G4700 Insomnia, unspecified: Secondary | ICD-10-CM | POA: Diagnosis not present

## 2023-05-03 DIAGNOSIS — Z0000 Encounter for general adult medical examination without abnormal findings: Secondary | ICD-10-CM | POA: Diagnosis not present

## 2023-05-03 DIAGNOSIS — Z125 Encounter for screening for malignant neoplasm of prostate: Secondary | ICD-10-CM | POA: Diagnosis not present

## 2023-10-08 ENCOUNTER — Encounter (INDEPENDENT_AMBULATORY_CARE_PROVIDER_SITE_OTHER): Payer: Self-pay | Admitting: Otolaryngology

## 2023-10-08 ENCOUNTER — Ambulatory Visit (INDEPENDENT_AMBULATORY_CARE_PROVIDER_SITE_OTHER): Admitting: Audiology

## 2023-10-08 ENCOUNTER — Ambulatory Visit (INDEPENDENT_AMBULATORY_CARE_PROVIDER_SITE_OTHER): Admitting: Otolaryngology

## 2023-10-08 VITALS — BP 120/78 | HR 65 | Ht 69.0 in | Wt 192.0 lb

## 2023-10-08 DIAGNOSIS — H903 Sensorineural hearing loss, bilateral: Secondary | ICD-10-CM | POA: Diagnosis not present

## 2023-10-08 DIAGNOSIS — H9313 Tinnitus, bilateral: Secondary | ICD-10-CM

## 2023-10-08 DIAGNOSIS — H9319 Tinnitus, unspecified ear: Secondary | ICD-10-CM

## 2023-10-08 NOTE — Progress Notes (Signed)
  [address] [place], [place] [address] [phone]  Audiological Evaluation    Name: Jesse Wilkins     DOB:   Sep 12, 1970      MRN:   [id number]                                                                                     Service Date: 10/08/2023     Accompanied by: unaccompanied to the booth   Patient comes today after Dr. Darlin Ehrlich, ENT sent a referral for a hearing evaluation due to concerns with tinnitus.   Symptoms Yes Details  Hearing loss  [x]  Difficulty hearing when in meetings  Tinnitus  [x]  Both ears - sounds like crickets and at times it gets worse. Says that while he went to [place] it seemed to go away. Doe snot affect sleep, but it is annoying at times.  Ear pain/ infections/pressure  []    Balance problems  []    Noise exposure history  []    Previous ear surgeries  []    Family history of hearing loss  []    Amplification  []    Other  []      Otoscopy: Right ear: Essentially external ear canal (a growth noticed) and notable landmarks visualized on the tympanic membrane. Left ear:  Clear external ear canal and notable landmarks visualized on the tympanic membrane.  Tympanometry: Right ear: Type A- Normal external ear canal volume with normal middle ear pressure and tympanic membrane compliance. Left ear: Type A- Normal external ear canal volume with normal middle ear pressure and tympanic membrane compliance.  Pure tone Audiometry: Both ears- Normal hearing from [phone] Hz , then moderate to moderately severe sensorineural hearing loss from 1500 Hz - 8000 Hz.    Speech Audiometry: Right ear- Speech Reception Threshold (SRT) was obtained at 20 dBHL. Left ear-Speech Reception Threshold (SRT) was obtained at 25 dBHL.   Word Recognition Score Tested using NU-6 (recorded) Right ear: 96% was obtained at a presentation level of 70 dBHL with contralateral masking which is deemed as  excellent. Left ear: 96% was obtained at a presentation level of  70 dBHL with contralateral masking which is deemed as  excellent.   The hearing test results were completed under headphones and results are deemed to be of good reliability. Test technique:  conventional     Recommendations: Follow up with ENT as scheduled for today. Return for a hearing evaluation if concerns with hearing changes arise or per MD recommendation. Consider various tinnitus strategies, including the use of a sound generator, hearing aids, and/or tinnitus retraining therapy. Consider a communication needs assessment after medical clearance for hearing aids is obtained.   Raoul Ciano MARIE LEROUX-MARTINEZ, AUD

## 2023-10-09 DIAGNOSIS — H9313 Tinnitus, bilateral: Secondary | ICD-10-CM | POA: Insufficient documentation

## 2023-10-09 DIAGNOSIS — H903 Sensorineural hearing loss, bilateral: Secondary | ICD-10-CM | POA: Insufficient documentation

## 2023-10-09 NOTE — Progress Notes (Signed)
 CC: Bilateral tinnitus, bilateral hearing loss  HPI:  Jesse Wilkins is a 53 y.o. male who presents today complaining of bilateral tinnitus and bilateral hearing loss since October 2024.  He describes his tinnitus as a constant cricket sound.  The tinnitus is nonpulsatile.  He has also noted increasing hearing difficulty, especially in noisy environments.  He denies any recent otitis media or otitis externa.  He has no previous otologic surgery.  Currently he denies any otalgia, otorrhea, or vertigo.  Past Medical History:  Diagnosis Date   Medical history non-contributory     Past Surgical History:  Procedure Laterality Date   adnoid     INCISIONAL HERNIA REPAIR N/A 04/13/2013   Procedure: HERNIA REPAIR INCISIONAL WITH MESH;  Surgeon: Jesse Bound, MD;  Location: [facility];  Service: General;  Laterality: N/A;   INSERTION OF MESH N/A 07/07/2012   Procedure: INSERTION OF MESH;  Surgeon: Jesse Rubinstein, MD;  Location: [facility];  Service: General;  Laterality: N/A;   LAPAROSCOPIC APPENDECTOMY N/A 04/15/2017   Procedure: LAPAROSCOPIC APPENDECTOMY;  Surgeon: Jesse Bogus, MD;  Location: [facility];  Service: General;  Laterality: N/A;   UMBILICAL HERNIA REPAIR N/A 07/07/2012   Procedure: HERNIA REPAIR UMBILICAL ADULT;  Surgeon: Jesse Rubinstein, MD;  Location: [facility];  Service: General;  Laterality: N/A;    History reviewed. No pertinent family history.  Social History:  reports that he has never smoked. He has never used smokeless tobacco. He reports that he does not drink alcohol and does not use drugs.  Allergies: No Known Allergies  Prior to Admission medications   Medication Sig Start Date End Date Taking? Authorizing Provider  VITAMIN D, ERGOCALCIFEROL, PO Take 1 tablet by mouth daily.   Yes [provider]  zinc  sulfate 220 (50 Zn) MG capsule Take 1 capsule (220 mg total) by mouth daily. 02/03/20  Yes Jesse Dawley, MD  albuterol  (VENTOLIN  HFA) 108 (90 Base) MCG/ACT  inhaler Inhale 2 puffs into the lungs every 4 (four) hours as needed for wheezing or shortness of breath. 02/02/20   Jesse Dawley, MD  ascorbic acid  (VITAMIN C) 500 MG tablet Take 1 tablet (500 mg total) by mouth daily. 02/03/20   Jesse Dawley, MD  chlorpheniramine-HYDROcodone  (TUSSIONEX) 10-8 MG/5ML SUER Take 5 mLs by mouth every 12 (twelve) hours as needed for cough. 02/02/20   Jesse Dawley, MD  gabapentin  (NEURONTIN ) 300 MG capsule Take 1 capsule (300 mg total) by mouth 3 (three) times daily for 7 days. 04/12/20 04/19/20  Jesse Wilkins, DPM  guaiFENesin -dextromethorphan (ROBITUSSIN DM) 100-10 MG/5ML syrup Take 10 mLs by mouth every 4 (four) hours as needed for cough. 02/02/20   Jesse Dawley, MD  Naphazoline HCl (CLEAR EYES OP) Apply 1 drop to eye daily as needed (dry eyes).    [provider]    Blood pressure 120/78, pulse 65, height 5' 9 (1.753 m), weight 192 lb (87.1 kg), SpO2 96%. Exam: General: Communicates without difficulty, well nourished, no acute distress. Head: Normocephalic, no evidence injury, no tenderness, facial buttresses intact without stepoff. Face/sinus: No tenderness to palpation and percussion. Facial movement is normal and symmetric. Eyes: PERRL, EOMI. No scleral icterus, conjunctivae clear. Neuro: CN II exam reveals vision grossly intact.  No nystagmus at any point of gaze. Ears: Auricles well formed without lesions.  Ear canals are intact without mass or lesion.  No erythema or edema is appreciated.  The TMs are intact without fluid. Nose: External evaluation reveals normal support  and skin without lesions.  Dorsum is intact.  Anterior rhinoscopy reveals normal mucosa over anterior aspect of inferior turbinates and intact septum.  No purulence noted. Oral:  Oral cavity and oropharynx are intact, symmetric, without erythema or edema.  Mucosa is moist without lesions. Neck: Full range of motion without pain.  There is no significant lymphadenopathy.   No masses palpable.  Thyroid bed within normal limits to palpation.  Parotid glands and submandibular glands equal bilaterally without mass.  Trachea is midline. Neuro:  CN 2-12 grossly intact.   His hearing test shows bilateral high-frequency sensorineural hearing loss, likely secondary to routine presbycusis.  Assessment: 1.  Bilateral symmetric high-frequency sensorineural hearing loss, likely secondary to routine presbycusis. 2.  His ear canals, tympanic membranes, and middle ear spaces are normal. 3.  His tinnitus is likely a direct result of the hearing loss.  Plan: 1.  The physical exam findings and the hearing test results are reviewed with the patient. 2.  The pathophysiology of subjective tinnitus is extensively discussed with patient. 3.  The strategies to cope with tinnitus, including the use of masker, hearing aids, tinnitus retraining therapy, and avoidance of caffeine and alcohol are discussed.  4.  The patient is a candidate for hearing amplification.  The hearing aid options are discussed. 5.  The patient will return for reevaluation in 1 year, sooner if needed.  Jesse Wilkins W Jesse Wilkins 10/09/2023, 10:58 AM

## 2023-10-11 ENCOUNTER — Encounter: Payer: Self-pay | Admitting: Audiology

## 2024-05-08 ENCOUNTER — Other Ambulatory Visit (HOSPITAL_COMMUNITY): Payer: Self-pay | Admitting: Internal Medicine

## 2024-05-08 DIAGNOSIS — E785 Hyperlipidemia, unspecified: Secondary | ICD-10-CM

## 2024-05-13 ENCOUNTER — Encounter (INDEPENDENT_AMBULATORY_CARE_PROVIDER_SITE_OTHER): Payer: Self-pay | Admitting: *Deleted

## 2024-06-30 ENCOUNTER — Other Ambulatory Visit (HOSPITAL_COMMUNITY)
